# Patient Record
Sex: Female | Born: 1950 | Race: Black or African American | Hispanic: No | Smoking: Former smoker
Health system: Southern US, Community
[De-identification: ages and names within clinical notes are randomized; demographics above are authoritative.]

## PROBLEM LIST (undated history)

## (undated) DIAGNOSIS — E119 Type 2 diabetes mellitus without complications: Secondary | ICD-10-CM

## (undated) DIAGNOSIS — I10 Essential (primary) hypertension: Secondary | ICD-10-CM

## (undated) DIAGNOSIS — C9590 Leukemia, unspecified not having achieved remission: Secondary | ICD-10-CM

## (undated) HISTORY — PX: LUNG SURGERY: SHX703

---

## 2018-05-21 ENCOUNTER — Inpatient Hospital Stay
Admission: EM | Admit: 2018-05-21 | Discharge: 2018-05-22 | DRG: 069 | Disposition: A | Discharge status: Home / Self Care | Payer: Medicaid Other | Attending: Specialist | Admitting: Specialist

## 2018-05-21 ENCOUNTER — Emergency Department: Payer: Medicaid Other

## 2018-05-21 ENCOUNTER — Other Ambulatory Visit: Payer: Self-pay

## 2018-05-21 ENCOUNTER — Inpatient Hospital Stay: Payer: Medicaid Other

## 2018-05-21 ENCOUNTER — Inpatient Hospital Stay (HOSPITAL_COMMUNITY)
Admit: 2018-05-21 | Discharge: 2018-05-21 | Disposition: A | Discharge status: Home / Self Care | Payer: Medicaid Other | Attending: Internal Medicine | Admitting: Internal Medicine

## 2018-05-21 DIAGNOSIS — R29810 Facial weakness: Secondary | ICD-10-CM | POA: Diagnosis present

## 2018-05-21 DIAGNOSIS — G459 Transient cerebral ischemic attack, unspecified: Principal | ICD-10-CM | POA: Diagnosis present

## 2018-05-21 DIAGNOSIS — E1151 Type 2 diabetes mellitus with diabetic peripheral angiopathy without gangrene: Secondary | ICD-10-CM | POA: Diagnosis present

## 2018-05-21 DIAGNOSIS — I129 Hypertensive chronic kidney disease with stage 1 through stage 4 chronic kidney disease, or unspecified chronic kidney disease: Secondary | ICD-10-CM | POA: Diagnosis present

## 2018-05-21 DIAGNOSIS — Z87891 Personal history of nicotine dependence: Secondary | ICD-10-CM | POA: Diagnosis not present

## 2018-05-21 DIAGNOSIS — N189 Chronic kidney disease, unspecified: Secondary | ICD-10-CM | POA: Diagnosis present

## 2018-05-21 DIAGNOSIS — R4781 Slurred speech: Secondary | ICD-10-CM | POA: Diagnosis present

## 2018-05-21 DIAGNOSIS — I639 Cerebral infarction, unspecified: Secondary | ICD-10-CM | POA: Diagnosis present

## 2018-05-21 DIAGNOSIS — Z856 Personal history of leukemia: Secondary | ICD-10-CM | POA: Diagnosis not present

## 2018-05-21 DIAGNOSIS — E1122 Type 2 diabetes mellitus with diabetic chronic kidney disease: Secondary | ICD-10-CM | POA: Diagnosis present

## 2018-05-21 DIAGNOSIS — I5030 Unspecified diastolic (congestive) heart failure: Secondary | ICD-10-CM | POA: Diagnosis not present

## 2018-05-21 HISTORY — DX: Essential (primary) hypertension: I10

## 2018-05-21 HISTORY — DX: Leukemia, unspecified not having achieved remission: C95.90

## 2018-05-21 HISTORY — DX: Type 2 diabetes mellitus without complications: E11.9

## 2018-05-21 LAB — PROTIME-INR
INR: 0.9
PROTHROMBIN TIME: 12.1 s (ref 11.4–15.2)

## 2018-05-21 LAB — CBC WITH DIFFERENTIAL/PLATELET
Basophils Absolute: 0.1 10*3/uL (ref 0–0.1)
Basophils Relative: 1 %
EOS ABS: 0.2 10*3/uL (ref 0–0.7)
EOS PCT: 2 %
HCT: 36.3 % (ref 35.0–47.0)
HEMOGLOBIN: 12.2 g/dL (ref 12.0–16.0)
LYMPHS ABS: 1.1 10*3/uL (ref 1.0–3.6)
Lymphocytes Relative: 11 %
MCH: 31.5 pg (ref 26.0–34.0)
MCHC: 33.5 g/dL (ref 32.0–36.0)
MCV: 94.1 fL (ref 80.0–100.0)
MONOS PCT: 11 %
Monocytes Absolute: 1.2 10*3/uL — ABNORMAL HIGH (ref 0.2–0.9)
NEUTROS PCT: 75 %
Neutro Abs: 7.8 10*3/uL — ABNORMAL HIGH (ref 1.4–6.5)
Platelets: 210 10*3/uL (ref 150–440)
RBC: 3.86 MIL/uL (ref 3.80–5.20)
RDW: 15.5 % — ABNORMAL HIGH (ref 11.5–14.5)
WBC: 10.3 10*3/uL (ref 3.6–11.0)

## 2018-05-21 LAB — COMPREHENSIVE METABOLIC PANEL
ALBUMIN: 3.6 g/dL (ref 3.5–5.0)
ALK PHOS: 195 U/L — AB (ref 38–126)
ALT: 55 U/L — AB (ref 0–44)
AST: 41 U/L (ref 15–41)
Anion gap: 7 (ref 5–15)
BUN: 18 mg/dL (ref 8–23)
CO2: 27 mmol/L (ref 22–32)
CREATININE: 1.62 mg/dL — AB (ref 0.44–1.00)
Calcium: 9.3 mg/dL (ref 8.9–10.3)
Chloride: 106 mmol/L (ref 98–111)
GFR calc Af Amer: 38 mL/min — ABNORMAL LOW (ref >60)
GFR calc non Af Amer: 33 mL/min — ABNORMAL LOW (ref >60)
GLUCOSE: 106 mg/dL — AB (ref 70–99)
Potassium: 5 mmol/L (ref 3.5–5.1)
SODIUM: 140 mmol/L (ref 135–145)
Total Bilirubin: 1.3 mg/dL — ABNORMAL HIGH (ref 0.3–1.2)
Total Protein: 7.2 g/dL (ref 6.5–8.1)

## 2018-05-21 LAB — TROPONIN I: Troponin I: <0.03 ng/mL (ref <0.03)

## 2018-05-21 LAB — GLUCOSE, CAPILLARY
GLUCOSE-CAPILLARY: 77 mg/dL (ref 70–99)
Glucose-Capillary: 136 mg/dL — ABNORMAL HIGH (ref 70–99)

## 2018-05-21 LAB — ECHOCARDIOGRAM COMPLETE

## 2018-05-21 MED ORDER — ASPIRIN 325 MG PO TABS
325.0000 mg | ORAL_TABLET | Freq: Every day | ORAL | Status: DC
  Administered 2018-05-21 – 2018-05-22 (×2): 325 mg via ORAL
  Filled 2018-05-21 (×2): qty 1

## 2018-05-21 MED ORDER — NILOTINIB HCL 150 MG PO CAPS: 300.0000 mg | ORAL_CAPSULE | Freq: Every day | ORAL | Status: DC

## 2018-05-21 MED ORDER — INSULIN ASPART 100 UNIT/ML ~~LOC~~ SOLN: 0.0000 [IU] | Freq: Three times a day (TID) | SUBCUTANEOUS | Status: DC

## 2018-05-21 MED ORDER — ACETAMINOPHEN 650 MG RE SUPP: 650.0000 mg | RECTAL | Status: DC | PRN

## 2018-05-21 MED ORDER — LINAGLIPTIN 5 MG PO TABS
5.0000 mg | ORAL_TABLET | Freq: Every day | ORAL | Status: DC
  Administered 2018-05-21 – 2018-05-22 (×2): 5 mg via ORAL
  Filled 2018-05-21 (×2): qty 1

## 2018-05-21 MED ORDER — ASPIRIN 300 MG RE SUPP: 300.0000 mg | Freq: Every day | RECTAL | Status: DC

## 2018-05-21 MED ORDER — ACETAMINOPHEN 160 MG/5ML PO SOLN
650.0000 mg | ORAL | Status: DC | PRN
  Filled 2018-05-21: qty 20.3

## 2018-05-21 MED ORDER — ACETAMINOPHEN 325 MG PO TABS: 650.0000 mg | ORAL_TABLET | ORAL | Status: DC | PRN

## 2018-05-21 MED ORDER — ENOXAPARIN SODIUM 40 MG/0.4ML ~~LOC~~ SOLN
40.0000 mg | SUBCUTANEOUS | Status: DC
  Administered 2018-05-21: 40 mg via SUBCUTANEOUS
  Filled 2018-05-21: qty 0.4

## 2018-05-21 MED ORDER — ATORVASTATIN CALCIUM 20 MG PO TABS
40.0000 mg | ORAL_TABLET | Freq: Every day | ORAL | Status: DC
  Administered 2018-05-21: 17:00:00 40 mg via ORAL
  Filled 2018-05-21: qty 2

## 2018-05-21 MED ORDER — STROKE: EARLY STAGES OF RECOVERY BOOK
Freq: Once | Status: AC
  Administered 2018-05-21: 14:00:00

## 2018-05-21 NOTE — Progress Notes (Signed)
PT Cancellation Note  Patient Details Name: Jennier Schissler MRN: [id number] DOB: 05-Jun-1951   Cancelled Treatment:    Reason Eval/Treat Not Completed: Patient at procedure or test/unavailable.  Patient is currently with imaging.  Will re-attempt later as time allows.   Roxanne Gates, PT, DPT 05/21/2018, 3:06 PM

## 2018-05-21 NOTE — ED Provider Notes (Signed)
[facility] Emergency Department Provider Note ____________________________________________   I have reviewed the triage vital signs and the triage nursing note.  HISTORY  Chief Complaint Weakness and Facial Droop   Historian Patient  HPI Ruth Fuentes is a 67 y.o. female arrived by EMS with complaint of left facial droop and left arm and left leg weakness.  Patient states it started before lunch yesterday.  She states that she knows she was having trouble gripping her using her left hand.  She states that her left leg seems weak when she walks.  Denies sensory changes.  No headache.  No history of stroke or heart attack in the past.  Does not use aspirin on a daily basis.  Symptoms are moderate.  No confusion or altered mental status.     Past Medical History:  Diagnosis Date  . Diabetes mellitus without complication ([place])   . Hypertension   . Leukemia ([place])   . Leukemia [facility])     Patient Active Problem List   Diagnosis Date Noted  . CVA (cerebral vascular accident) ([place]) 05/21/2018    Past Surgical History:  Procedure Laterality Date  . LUNG SURGERY      Prior to Admission medications   Medication Sig Start Date End Date Taking? Authorizing Provider  amLODipine (NORVASC) 10 MG tablet Take 10 mg by mouth daily.   Yes [provider]  metoprolol tartrate (LOPRESSOR) 100 MG tablet Take 100 mg by mouth 2 (two) times daily.   Yes [provider]  nilotinib (TASIGNA) 150 MG capsule Take 300 mg by mouth daily. Take on an empty stomach, 1 hr before or 2 hrs after food.   Yes [provider]  sitaGLIPtin (JANUVIA) 100 MG tablet Take 100 mg by mouth daily.   Yes [provider]    Allergies  Allergen Reactions  . Metformin And Related     Family History  Problem Relation Age of Onset  . Diabetes Mother     Social History Social History   Tobacco Use  . Smoking status: Former Research scientist (life sciences)  . Smokeless  tobacco: Never Used  Substance Use Topics  . Alcohol use: Not Currently  . Drug use: Never    Review of Systems  Constitutional: Negative for fever. Eyes: Negative for visual changes. ENT: Negative for sore throat. Cardiovascular: Negative for chest pain. Respiratory: Negative for shortness of breath. Gastrointestinal: Negative for abdominal pain, vomiting and diarrhea. Genitourinary: Negative for dysuria. Musculoskeletal: Negative for back pain. Skin: Negative for rash. Neurological: Negative for headache.  ____________________________________________   PHYSICAL EXAM:  VITAL SIGNS: ED Triage Vitals  Enc Vitals Group     BP 05/21/18 0906 129/76     Pulse Rate 05/21/18 1030 88     Resp 05/21/18 0906 18     Temp 05/21/18 0906 98.5 F (36.9 C)     Temp Source 05/21/18 0906 Oral     SpO2 05/21/18 0906 100 %     Weight --      Height --      Head Circumference --      Peak Flow --      Pain Score 05/21/18 0907 0     Pain Loc --      Pain Edu? --      Excl. in [place]? --      Constitutional: Alert and oriented.  HEENT      Head: Normocephalic and atraumatic.  Left facial droop.      Eyes: Conjunctivae are  normal. Pupils equal and round.       Ears:         Nose: No congestion/rhinnorhea.      Mouth/Throat: Mucous membranes are moist.      Neck: No stridor. Cardiovascular/Chest: Normal rate, regular rhythm.  No murmurs, rubs, or gallops. Respiratory: Normal respiratory effort without tachypnea nor retractions. Breath sounds are clear and equal bilaterally. No wheezes/rales/rhonchi. Gastrointestinal: Soft. No distention, no guarding, no rebound. Nontender.    Genitourinary/rectal:Deferred Musculoskeletal: Nontender with normal range of motion in all extremities. No joint effusions.  No lower extremity tenderness.  No edema. Neurologic: Left facial droop.  Normal speech and language.  Sensory changes.  4-5 strength in left arm and left leg. Skin:  Skin is warm, dry and  intact. No rash noted. Psychiatric: Mood and affect are normal. Speech and behavior are normal. Patient exhibits appropriate insight and judgment.   ____________________________________________  LABS (pertinent positives/negatives) I, Lisa Roca, MD the attending physician have reviewed the labs noted below.  Labs Reviewed  COMPREHENSIVE METABOLIC PANEL - Abnormal; Notable for the following components:      Result Value   Glucose, Bld 106 (*)    Creatinine, Ser 1.62 (*)    ALT 55 (*)    Alkaline Phosphatase 195 (*)    Total Bilirubin 1.3 (*)    GFR calc non Af Amer 33 (*)    GFR calc Af Amer 38 (*)    All other components within normal limits  CBC WITH DIFFERENTIAL/PLATELET - Abnormal; Notable for the following components:   RDW 15.5 (*)    Neutro Abs 7.8 (*)    Monocytes Absolute 1.2 (*)    All other components within normal limits  TROPONIN I  PROTIME-INR  URINALYSIS, COMPLETE (UACMP) WITH MICROSCOPIC    ____________________________________________    EKG I, Lisa Roca, MD, the attending physician have personally viewed and interpreted all ECGs.  None ____________________________________________  RADIOLOGY   CT without contrast for by me I reviewed the radiologist report: No acute finding. __________________________________________  PROCEDURES  Procedure(s) performed: None  Procedures  Critical Care performed: None   ____________________________________________  ED COURSE / ASSESSMENT AND PLAN  Pertinent labs & imaging results that were available during my care of the patient were reviewed by me and considered in my medical decision making (see chart for details).    Patient with symptoms of stroke with left-sided facial, arm and leg weakness but onset was yesterday afternoon.  Outside of TPA window.  Did initiate stroke work-up.  Initial head CT eval shows no focal finding, but suspect stroke still will admit to hospitalist for work-up and  MRI.    CONSULTATIONS:   Hospitalist for admission.   Patient / Family / Caregiver informed of clinical course, medical decision-making process, and agree with plan.    ___________________________________________   FINAL CLINICAL IMPRESSION(S) / ED DIAGNOSES   Final diagnoses:  Cerebrovascular accident (CVA), unspecified mechanism ([place])      ___________________________________________         Note: This dictation was prepared with Sales executive. Any transcriptional errors that result from this process are unintentional    Lisa Roca, MD 05/21/18 1225

## 2018-05-21 NOTE — Progress Notes (Signed)
Patient admitted to room 132 from the ED with CVA. Patient alert oriented x 4, family at bedside. No c/o of pain at this time. Patient in bed locked at low position, bedside table, call light and telephone within patient reach. Will continue to monitor patient. Report received from [place], Therapist, sports.

## 2018-05-21 NOTE — Progress Notes (Signed)
Anticoagulation monitoring(Lovenox):   67 yo female ordered Lovenox 30 mg Q24h  Filed Weights   05/21/18 1417  Weight: 173 lb 11.6 oz (78.8 kg)   BMI    Lab Results  Component Value Date   CREATININE 1.62 (H) 05/21/2018   Estimated Creatinine Clearance: 36.1 mL/min (A) (by C-G formula based on SCr of 1.62 mg/dL (H)). Hemoglobin & Hematocrit     Component Value Date/Time   HGB 12.2 05/21/2018 0950   HCT 36.3 05/21/2018 0950     Per Protocol for Patient with estCrcl > 30 ml/min and BMI < 40, will transition to Lovenox 40 mg Q24h.

## 2018-05-21 NOTE — H&P (Addendum)
[place] at [place] NAME: Ruth Fuentes    MR#:  [id number]  DATE OF BIRTH:  Feb 11, 1951  DATE OF ADMISSION:  05/21/2018  PRIMARY CARE PHYSICIAN: Patient, No Pcp Per   REQUESTING/REFERRING PHYSICIAN: Lisa Roca Md  CHIEF COMPLAINT:   Chief Complaint  Patient presents with  . Weakness  . Facial Droop    HISTORY OF PRESENT ILLNESS: Ruth Fuentes  is a 67 y.o. female with a known history of diabetes type 2, essential hypertension, leukemia who is currently visiting her family here presenting with complaint of left upper extremity left lower extremity weakness associated with facial droop.  Patient states that she was in her normal state of health until yesterday morning when she developed the symptoms she started having difficulty with walking.  She also stated that yesterday she was trying to drink soda and she started coughing.  Patient feels unsteady on her feet.  Work-up in the ER with a CT scan of the head is negative but there is high suspicion for stroke.  PAST MEDICAL HISTORY:   Past Medical History:  Diagnosis Date  . Diabetes mellitus without complication ([place])   . Hypertension   . Leukemia ([place])   . Leukemia ([place])     PAST SURGICAL HISTORY:  Past Surgical History:  Procedure Laterality Date  . LUNG SURGERY      SOCIAL HISTORY:  Social History   Tobacco Use  . Smoking status: Former Research scientist (life sciences)  . Smokeless tobacco: Never Used  Substance Use Topics  . Alcohol use: Not Currently    FAMILY HISTORY:  Family History  Problem Relation Age of Onset  . Diabetes Mother     DRUG ALLERGIES:  Allergies  Allergen Reactions  . Metformin And Related     REVIEW OF SYSTEMS:   CONSTITUTIONAL: No fever, fatigue or weakness.  EYES: No blurred or double vision.  EARS, NOSE, AND THROAT: No tinnitus or ear pain.  RESPIRATORY: No cough, shortness of breath, wheezing or hemoptysis.  CARDIOVASCULAR: No chest pain, orthopnea,  edema.  GASTROINTESTINAL: No nausea, vomiting, diarrhea or abdominal pain.  GENITOURINARY: No dysuria, hematuria.  ENDOCRINE: No polyuria, nocturia,  HEMATOLOGY: No anemia, easy bruising or bleeding SKIN: No rash or lesion. MUSCULOSKELETAL: No joint pain or arthritis.   NEUROLOGIC: Positive facial droop, left upper extremity left lower extremity weakness PSYCHIATRY: No anxiety or depression.   MEDICATIONS AT HOME:  Prior to Admission medications   Not on File      PHYSICAL EXAMINATION:   VITAL SIGNS: Blood pressure 131/71, pulse 60, temperature 98.6 F (37 C), temperature source Oral, resp. rate 20, SpO2 97 %.  GENERAL:  67 y.o.-year-old patient lying in the bed with no acute distress.  EYES: Pupils equal, round, reactive to light and accommodation. No scleral icterus. Extraocular muscles intact.  HEENT: Head atraumatic, normocephalic. Oropharynx and nasopharynx clear.  NECK:  Supple, no jugular venous distention. No thyroid enlargement, no tenderness.  LUNGS: Normal breath sounds bilaterally, no wheezing, rales,rhonchi or crepitation. No use of accessory muscles of respiration.  CARDIOVASCULAR: S1, S2 normal. No murmurs, rubs, or gallops.  ABDOMEN: Soft, nontender, nondistended. Bowel sounds present. No organomegaly or mass.  EXTREMITIES: No pedal edema, cyanosis, or clubbing.  NEUROLOGIC: Left-sided facial droop,, left upper extremity left lower extremity weakness 4 out of 5 PSYCHIATRIC: The patient is alert and oriented x 3.  SKIN: No obvious rash, lesion, or ulcer.   LABORATORY PANEL:   CBC Recent Labs  Lab  05/21/18 0950  WBC 10.3  HGB 12.2  HCT 36.3  PLT 210  MCV 94.1  MCH 31.5  MCHC 33.5  RDW 15.5*  LYMPHSABS 1.1  MONOABS 1.2*  EOSABS 0.2  BASOSABS 0.1   ------------------------------------------------------------------------------------------------------------------  Chemistries  Recent Labs  Lab 05/21/18 0950  NA 140  K 5.0  CL 106  CO2 27   GLUCOSE 106*  BUN 18  CREATININE 1.62*  CALCIUM 9.3  AST 41  ALT 55*  ALKPHOS 195*  BILITOT 1.3*   ------------------------------------------------------------------------------------------------------------------ CrCl cannot be calculated (Unknown ideal weight.). ------------------------------------------------------------------------------------------------------------------ No results for input(s): TSH, T4TOTAL, T3FREE, THYROIDAB in the last 72 hours.  Invalid input(s): FREET3   Coagulation profile Recent Labs  Lab 05/21/18 0950  INR 0.90   ------------------------------------------------------------------------------------------------------------------- No results for input(s): DDIMER in the last 72 hours. -------------------------------------------------------------------------------------------------------------------  Cardiac Enzymes Recent Labs  Lab 05/21/18 0950  TROPONINI <0.03   ------------------------------------------------------------------------------------------------------------------ Invalid input(s): POCBNP  ---------------------------------------------------------------------------------------------------------------  Urinalysis No results found for: COLORURINE, APPEARANCEUR, LABSPEC, PHURINE, GLUCOSEU, HGBUR, BILIRUBINUR, KETONESUR, PROTEINUR, UROBILINOGEN, NITRITE, LEUKOCYTESUR   RADIOLOGY: Ct Head Wo Contrast  Result Date: 05/21/2018 CLINICAL DATA:  Left-sided facial droop and left-sided weakness EXAM: CT HEAD WITHOUT CONTRAST TECHNIQUE: Contiguous axial images were obtained from the base of the skull through the vertex without intravenous contrast. COMPARISON:  None. FINDINGS: Brain: There is mild-to-moderate atrophy. There is no intracranial mass, hemorrhage, extra-axial fluid collection, or midline shift. There is decreased attenuation felt to represent small vessel disease in the centra semiovale bilaterally. No acute appearing infarct  evident. Vascular: No hyperdense vessel. There is calcification in each carotid siphon region. Skull: Bony calvarium appears intact. Sinuses/Orbits: There is mucosal thickening in several ethmoid air cells. Other visualized paranasal sinuses are clear. Visualized orbits appear symmetric bilaterally. Other: Visualized mastoid air cells are clear. IMPRESSION: Atrophy with supratentorial small vessel disease. No acute infarct evident. No mass or hemorrhage. There are foci of carotid artery calcification. There is mucosal thickening in several ethmoid air cells. Electronically Signed   By: Lowella Grip III M.D.   On: 05/21/2018 09:37    EKG: No orders found for this or any previous visit.  IMPRESSION AND PLAN: Patient 68 year old with history of diabetes presenting with strokelike symptoms  1.  Acute CVA CT scan is negative We will obtain MRI MRA of the brain, carotid Dopplers, echocardiogram of the heart Swallow eval PT eval Start aspirin Start Lipitor Neurology evaluation  2.  Diabetes type 2 we will obtain home patient's home medication resume once available Place patient on sliding scale insulin once started on a diet  3.  Chronic kidney disease patient's creatinine from 2017 was 1.30 I have no further creatinine available in the system  4.  Essential hypertension pressure currently normal we will hold blood pressure medications for now  5.  History of leukemia according to patient she states that she is on some sort of pills will resume once known with that pill is  6.  Miscellaneous Lovenox for DVT prophylaxis     All the records are reviewed and case discussed with ED provider. Management plans discussed with the patient, family and they are in agreement.  CODE STATUS:    TOTAL TIME TAKING CARE OF THIS PATIENT: 55 minutes.    Dustin Flock M.D on 05/21/2018 at 1:33 PM  Between 7am to 6pm - Pager - [phone]  After 6pm go to [email or web address] - password [employer]  [facility] Office  [phone]  CC: Primary care physician; Patient,  No Pcp Per

## 2018-05-21 NOTE — ED Triage Notes (Signed)
Pt came to ED via EMS from home. Pt lives at home with daughter. Yesterday around noon, pt noticed heaviness in left leg. Pt went to sleep around 10pm last night. This morning daughter noticed left sided facial droop. Pt is alert and oriented, noticeable left side weakness. VS stable at this time.

## 2018-05-21 NOTE — Plan of Care (Signed)
  Problem: Education: Goal: Knowledge of disease or condition will improve Outcome: Progressing Goal: Knowledge of secondary prevention will improve Outcome: Progressing   Problem: Coping: Goal: Will verbalize positive feelings about self Outcome: Progressing   Problem: Self-Care: Goal: Ability to participate in self-care as condition permits will improve Outcome: Progressing Goal: Ability to communicate needs accurately will improve Outcome: Progressing   Problem: Education: Goal: Knowledge of General Education information will improve Description Including pain rating scale, medication(s)/side effects and non-pharmacologic comfort measures Outcome: Progressing   Problem: Clinical Measurements: Goal: Diagnostic test results will improve Outcome: Progressing   Problem: Activity: Goal: Risk for activity intolerance will decrease Outcome: Progressing

## 2018-05-21 NOTE — Progress Notes (Signed)
*  PRELIMINARY RESULTS* Echocardiogram 2D Echocardiogram has been performed.  Sherrie Sport 05/21/2018, 1:59 PM

## 2018-05-21 NOTE — Progress Notes (Signed)
OT Cancellation Note  Patient Details Name: Ruth Fuentes MRN: [id number] DOB: [birth year]   Cancelled Treatment:    Reason Eval/Treat Not Completed: Patient at procedure or test/ unavailable. Order received, chart reviewed. Pt out of room for diagnostic testing. Will re-attempt OT evaluation at later date/time as pt is available and medically appropriate.  Jeni Salles, MPH, MS, OTR/L ascom [phone] 05/21/18, 2:38 PM

## 2018-05-21 NOTE — Consult Note (Signed)
Referring Physician: Dustin Flock, MD    Chief Complaint: Left facial droop, left-sided weakness  HPI: Ruth Fuentes is an 67 y.o. female with history of diabetes type 2, hypertension, and leukemia presented to the ED with complaints of left facial droop, left upper and lower extremity weakness.  Patient states that she was at home babysitting 1 of her granddaughters yesterday at around lunchtime when she suddenly felt heaviness in her left leg and left.  She felt like she could not move left hand and leg leg.  She was also unsteady on her feet. She denies other associated symptoms of confusion, visual disturbance, headaches, dizziness, or other focal neurological deficit. Patient state that she went to bed hoping that the symptoms would resolve on their own.  When she woke up in the morning, daughter noticed that her left face was drooped and she was slurring her speech, she therefore called EMS due to concerns of stroke.  Patient had a stroke work-up in the ED including CT of the head which not show any acute intracranial abnormality.  Due to her present focal neurologic deficit and stroke risk factors she was admitted for further stroke work-up.  Date last known well: Date: 05/20/2018 Time last known well: Time: 10:00 tPA Given: No: outside window period  Past Medical History:  Diagnosis Date  . Diabetes mellitus without complication ([place])   . Hypertension   . Leukemia ([employer])   . Leukemia [facility])     Past Surgical History:  Procedure Laterality Date  . LUNG SURGERY      Family History  Problem Relation Age of Onset  . Diabetes Mother    Social History:  reports that she has quit smoking. She has never used smokeless tobacco. She reports that she drank alcohol. She reports that she does not use drugs.  Allergies:  Allergies  Allergen Reactions  . Metformin And Related     Medications:  I have reviewed the patient's current medications. Prior to Admission:  Medications  Prior to Admission  Medication Sig Dispense Refill Last Dose  . amLODipine (NORVASC) 10 MG tablet Take 10 mg by mouth daily.   05/20/2018 at Unknown time  . metoprolol tartrate (LOPRESSOR) 100 MG tablet Take 100 mg by mouth 2 (two) times daily.   05/20/2018 at Unknown time  . nilotinib (TASIGNA) 150 MG capsule Take 300 mg by mouth daily. Take on an empty stomach, 1 hr before or 2 hrs after food.   05/20/2018 at Unknown time  . sitaGLIPtin (JANUVIA) 100 MG tablet Take 100 mg by mouth daily.   05/20/2018 at Unknown time   Scheduled: . aspirin  300 mg Rectal Daily   Or  . aspirin  325 mg Oral Daily  . atorvastatin  40 mg Oral q1800  . enoxaparin (LOVENOX) injection  40 mg Subcutaneous Q24H  . insulin aspart  0-9 Units Subcutaneous TID WC  . linagliptin  5 mg Oral Daily  . nilotinib  300 mg Oral Daily    ROS: History obtained from the patient   General ROS: negative for - chills, fatigue, fever, night sweats, weight gain or weight loss Psychological ROS: negative for - behavioral disorder, hallucinations, memory difficulties, mood swings or suicidal ideation Ophthalmic ROS: negative for - blurry vision, double vision, eye pain or loss of vision ENT ROS: negative for - epistaxis, nasal discharge, oral lesions, sore throat, tinnitus or vertigo Allergy and Immunology ROS: negative for - hives or itchy/watery eyes Hematological and Lymphatic ROS: negative for -  bleeding problems, bruising or swollen lymph nodes Endocrine ROS: negative for - galactorrhea, hair pattern changes, polydipsia/polyuria or temperature intolerance Respiratory ROS: negative for - cough, hemoptysis, shortness of breath or wheezing Cardiovascular ROS: negative for - chest pain, dyspnea on exertion, edema or irregular heartbeat Gastrointestinal ROS: negative for - abdominal pain, diarrhea, hematemesis, nausea/vomiting or stool incontinence Genito-Urinary ROS: negative for - dysuria, hematuria, incontinence or urinary  frequency/urgency Musculoskeletal ROS: negative for - joint swelling or muscular weakness Neurological ROS: as noted in HPI Dermatological ROS: negative for rash and skin lesion changes  Physical Exam   Vitals Blood pressure 130/68, pulse 74, temperature 98 F (36.7 C), temperature source Oral, resp. rate 17, height 5' 4.57" (1.64 m), weight 78.8 kg, SpO2 94 %.   HEENT-  Normocephalic, no lesions, without obvious abnormality.  Normal external eye and conjunctiva.  Normal TM's bilaterally.  Normal auditory canals and external ears. Normal external nose, mucus membranes and septum.  Normal pharynx. Cardiovascular- S1, S2 normal, pulses palpable throughout   Lungs- chest clear, no wheezing, rales, normal symmetric air entry Abdomen- soft, non-tender; bowel sounds normal; no masses,  no organomegaly Extremities- no edema Lymph-no adenopathy palpable Musculoskeletal-no joint tenderness, deformity or swelling Skin-warm and dry, no hyperpigmentation, vitiligo, or suspicious lesions  Neurological Exam   Mental Status: Alert, oriented, thought content appropriate. Speech mildly slurred without evidence of aphasia. Able to follow 3 step commands without difficulty. Attention span and concentration seemed appropriate  Cranial Nerves: II: Discs flat bilaterally; Visual fields grossly normal, pupils equal, round, reactive to light and accommodation III,IV, VI: ptosis not present, extra-ocular motions intact bilaterally V,VII: smile asymmetric,Left facial droop,facial light touch sensationintact VIII: hearing normal bilaterally IX,X: gag reflex present XI: bilateral shoulder shrug XII: midline tongue extension Motor: Right :Upper extremity 5/5Without pronator driftLeft: Upper extremity 4+/5 without pronator drift Right:Lower extremity 5/5Left: Lower extremity 4+/5 Tone and bulk:normal tone throughout; no atrophy noted Sensory:  Pinprick and light touchdecreased on the right Deep Tendon Reflexes: 2+ and symmetric throughout Plantars: Right:muteLeft: mute Cerebellar: Finger-to-nosetesting intact bilaterally.Heel to shin testing normal bilaterally Gait: not tested due to safety concerns  Data Reviewed  Laboratory Studies:  Basic Metabolic Panel: Recent Labs  Lab 05/21/18 0950  NA 140  K 5.0  CL 106  CO2 27  GLUCOSE 106*  BUN 18  CREATININE 1.62*  CALCIUM 9.3    Liver Function Tests: Recent Labs  Lab 05/21/18 0950  AST 41  ALT 55*  ALKPHOS 195*  BILITOT 1.3*  PROT 7.2  ALBUMIN 3.6   No results for input(s): LIPASE, AMYLASE in the last 168 hours. No results for input(s): AMMONIA in the last 168 hours.  CBC: Recent Labs  Lab 05/21/18 0950  WBC 10.3  NEUTROABS 7.8*  HGB 12.2  HCT 36.3  MCV 94.1  PLT 210    Cardiac Enzymes: Recent Labs  Lab 05/21/18 0950  TROPONINI <0.03    BNP: Invalid input(s): POCBNP  CBG: Recent Labs  Lab 05/21/18 8250  NLZJQB 34    Microbiology: No results found for this or any previous visit.  Coagulation Studies: Recent Labs    05/21/18 0950  LABPROT 12.1  INR 0.90    Urinalysis: No results for input(s): COLORURINE, LABSPEC, PHURINE, GLUCOSEU, HGBUR, BILIRUBINUR, KETONESUR, PROTEINUR, UROBILINOGEN, NITRITE, LEUKOCYTESUR in the last 168 hours.  Invalid input(s): APPERANCEUR  Lipid Panel: No results found for: CHOL, TRIG, HDL, CHOLHDL, VLDL, LDLCALC  HgbA1C: No results found for: HGBA1C  Urine Drug Screen:  No results found  for: LABOPIA, COCAINSCRNUR, LABBENZ, AMPHETMU, THCU, LABBARB  Alcohol Level: No results for input(s): ETH in the last 168 hours.   Imaging: Ct Head Wo Contrast  Result Date: 05/21/2018 CLINICAL DATA:  Left-sided facial droop and left-sided weakness EXAM: CT HEAD WITHOUT CONTRAST TECHNIQUE: Contiguous axial images were obtained from the base of the skull through the vertex without  intravenous contrast. COMPARISON:  None. FINDINGS: Brain: There is mild-to-moderate atrophy. There is no intracranial mass, hemorrhage, extra-axial fluid collection, or midline shift. There is decreased attenuation felt to represent small vessel disease in the centra semiovale bilaterally. No acute appearing infarct evident. Vascular: No hyperdense vessel. There is calcification in each carotid siphon region. Skull: Bony calvarium appears intact. Sinuses/Orbits: There is mucosal thickening in several ethmoid air cells. Other visualized paranasal sinuses are clear. Visualized orbits appear symmetric bilaterally. Other: Visualized mastoid air cells are clear. IMPRESSION: Atrophy with supratentorial small vessel disease. No acute infarct evident. No mass or hemorrhage. There are foci of carotid artery calcification. There is mucosal thickening in several ethmoid air cells. Electronically Signed   By: Lowella Grip III M.D.   On: 05/21/2018 09:37   Mr Brain Wo Contrast  Result Date: 05/21/2018 CLINICAL DATA:  Initial evaluation for acute left-sided weakness, facial droop. EXAM: MRI HEAD WITHOUT CONTRAST MRA HEAD WITHOUT CONTRAST TECHNIQUE: Multiplanar, multiecho pulse sequences of the brain and surrounding structures were obtained without intravenous contrast. Angiographic images of the head were obtained using MRA technique without contrast. COMPARISON:  Prior CT from earlier the same day. FINDINGS: MRI HEAD FINDINGS Brain: Diffuse prominence of the CSF containing spaces compatible with generalized cerebral atrophy. Prominent patchy and confluent T2/FLAIR hyperintensity within the periventricular and deep white matter both cerebral hemispheres most consistent with chronic small vessel ischemic disease, moderate in nature. Chronic microvascular changes present within the pons. Few small remote bilateral cerebellar infarcts noted. Patchy small volume diffusion abnormality seen involving the right basal  ganglia/corona radiata (series 5, image 31). Mild patchy involvement of the posterior right lentiform nucleus (series 5, image 27). No associated mass effect or hemorrhage. No other evidence for acute or subacute ischemia. No acute intracranial hemorrhage. No mass lesion, midline shift or mass effect. Diffuse ventricular prominence most like related global parenchymal volume loss of hydrocephalus. Underlying component of NPH would be difficult to exclude. Pituitary gland normal. Vascular: Major intracranial vascular flow voids maintained. Skull and upper cervical spine: Craniocervical junction normal. No focal marrow replacing lesion. Scalp soft tissues unremarkable. Sinuses/Orbits: Globes and orbital soft tissues within normal limits. Paranasal sinuses are clear. Trace right mastoid effusion, of doubtful significance. Other: None. MRA HEAD FINDINGS ANTERIOR CIRCULATION: Examination technically degraded by motion artifact. Petrous segments widely patent bilaterally. Scattered atheromatous irregularity within the cavernous/supraclinoid ICAs without hemodynamically significant stenosis. Focal severe stenosis at the proximal right A1 segment (series 1039, image 12). A1 segments otherwise patent. Grossly normal anterior communicating artery. ACAs irregular but patent to their distal aspects without stenosis. M1 segments mildly irregular as well without high-grade stenosis. Moderate atheromatous irregularity throughout the distal MCA branches which are fairly well perfused and symmetric. POSTERIOR CIRCULATION: Visualized vertebral arteries patent to the vertebrobasilar junction without stenosis. Right vertebral artery dominant. Basilar mildly irregular but widely patent to its distal aspect without stenosis. Superior cerebral arteries patent bilaterally. Both of the posterior cerebral artery supplied via the basilar. Short-segment moderate proximal left P2 and distal right P2 stenoses. PCAs patent to their distal  aspects. No intracranial aneurysm. IMPRESSION: MRI HEAD IMPRESSION: 1. Patchy small  volume acute ischemic infarcts involving the right basal ganglia/corona radiata without mass effect. 2. Underlying moderate to advanced chronic microvascular ischemic disease, with few scatter remote bilateral cerebellar infarcts. 3. Diffuse ventricular prominence, most likely related to underlying global atrophy. A superimposed component of NPH would be difficult to exclude, and could be considered in the correct clinical setting. MRA HEAD IMPRESSION: 1. Negative intracranial MRA for large vessel occlusion. No proximal high-grade or correctable stenosis identified. 2. Moderate diffuse atheromatous irregularity throughout the intracranial circulation. Electronically Signed   By: Jeannine Boga M.D.   On: 05/21/2018 15:43   US Carotid Bilateral (at [facility] And [facility] Only)  Result Date: 05/21/2018 CLINICAL DATA:  Stroke.  Left-sided weakness.  Mouth droop. EXAM: BILATERAL CAROTID DUPLEX ULTRASOUND TECHNIQUE: Pearline Cables scale imaging, color Doppler and duplex ultrasound were performed of bilateral carotid and vertebral arteries in the neck. COMPARISON:  None. FINDINGS: Criteria: Quantification of carotid stenosis is based on velocity parameters that correlate the residual internal carotid diameter with NASCET-based stenosis levels, using the diameter of the distal internal carotid lumen as the denominator for stenosis measurement. The following velocity measurements were obtained: RIGHT ICA: 105 cm/sec CCA: 093 cm/sec SYSTOLIC ICA/CCA RATIO:  0.8 ECA: 112 cm/sec LEFT ICA: 86 cm/sec CCA: 98 cm/sec SYSTOLIC ICA/CCA RATIO:  0.9 ECA: 268 cm/sec RIGHT CAROTID ARTERY: Mild smooth soft plaque in the bulb. Low resistance internal carotid Doppler pattern. RIGHT VERTEBRAL ARTERY:  Antegrade. LEFT CAROTID ARTERY: There is smooth mixed plaque in the lower bulb. In the mid and upper bulb, there is irregular calcified plaque which extends into the  external carotid artery. Low resistance internal carotid Doppler pattern is preserved. LEFT VERTEBRAL ARTERY:  Could not be visualized. IMPRESSION: Less than 50% stenosis in the right and left internal carotid arteries. Right vertebral artery is antegrade in flow. The left vertebral artery could not be identified. It may be occluded. Electronically Signed   By: Marybelle Killings M.D.   On: 05/21/2018 16:39   Mr Jodene Nam Head/brain GH Cm  Result Date: 05/21/2018 CLINICAL DATA:  Initial evaluation for acute left-sided weakness, facial droop. EXAM: MRI HEAD WITHOUT CONTRAST MRA HEAD WITHOUT CONTRAST TECHNIQUE: Multiplanar, multiecho pulse sequences of the brain and surrounding structures were obtained without intravenous contrast. Angiographic images of the head were obtained using MRA technique without contrast. COMPARISON:  Prior CT from earlier the same day. FINDINGS: MRI HEAD FINDINGS Brain: Diffuse prominence of the CSF containing spaces compatible with generalized cerebral atrophy. Prominent patchy and confluent T2/FLAIR hyperintensity within the periventricular and deep white matter both cerebral hemispheres most consistent with chronic small vessel ischemic disease, moderate in nature. Chronic microvascular changes present within the pons. Few small remote bilateral cerebellar infarcts noted. Patchy small volume diffusion abnormality seen involving the right basal ganglia/corona radiata (series 5, image 31). Mild patchy involvement of the posterior right lentiform nucleus (series 5, image 27). No associated mass effect or hemorrhage. No other evidence for acute or subacute ischemia. No acute intracranial hemorrhage. No mass lesion, midline shift or mass effect. Diffuse ventricular prominence most like related global parenchymal volume loss of hydrocephalus. Underlying component of NPH would be difficult to exclude. Pituitary gland normal. Vascular: Major intracranial vascular flow voids maintained. Skull and upper  cervical spine: Craniocervical junction normal. No focal marrow replacing lesion. Scalp soft tissues unremarkable. Sinuses/Orbits: Globes and orbital soft tissues within normal limits. Paranasal sinuses are clear. Trace right mastoid effusion, of doubtful significance. Other: None. MRA HEAD FINDINGS ANTERIOR CIRCULATION: Examination technically degraded by  motion artifact. Petrous segments widely patent bilaterally. Scattered atheromatous irregularity within the cavernous/supraclinoid ICAs without hemodynamically significant stenosis. Focal severe stenosis at the proximal right A1 segment (series 1039, image 12). A1 segments otherwise patent. Grossly normal anterior communicating artery. ACAs irregular but patent to their distal aspects without stenosis. M1 segments mildly irregular as well without high-grade stenosis. Moderate atheromatous irregularity throughout the distal MCA branches which are fairly well perfused and symmetric. POSTERIOR CIRCULATION: Visualized vertebral arteries patent to the vertebrobasilar junction without stenosis. Right vertebral artery dominant. Basilar mildly irregular but widely patent to its distal aspect without stenosis. Superior cerebral arteries patent bilaterally. Both of the posterior cerebral artery supplied via the basilar. Short-segment moderate proximal left P2 and distal right P2 stenoses. PCAs patent to their distal aspects. No intracranial aneurysm. IMPRESSION: MRI HEAD IMPRESSION: 1. Patchy small volume acute ischemic infarcts involving the right basal ganglia/corona radiata without mass effect. 2. Underlying moderate to advanced chronic microvascular ischemic disease, with few scatter remote bilateral cerebellar infarcts. 3. Diffuse ventricular prominence, most likely related to underlying global atrophy. A superimposed component of NPH would be difficult to exclude, and could be considered in the correct clinical setting. MRA HEAD IMPRESSION: 1. Negative intracranial  MRA for large vessel occlusion. No proximal high-grade or correctable stenosis identified. 2. Moderate diffuse atheromatous irregularity throughout the intracranial circulation. Electronically Signed   By: Jeannine Boga M.D.   On: 05/21/2018 15:43    Assessment: 67 y.o. 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 She also had MRA head which did not show large vessel occlusion.  Echocardiogram currently pending.  Carotid dopplers show no evidence of hemodynamically significant stenosis in the carotid arteries, with questionable left vertebral occlusion.  A1c pending, LDL pending.Patient was not on any anticoagulant or antiplatelet therapy prior to this event.  Stroke Risk Factors - diabetes mellitus and hypertension  Plan: 1. HgbA1c, fasting lipid panel pending 2. Start Aspirin 81 mg daily 3. PT consult, OT consult, Speech consult 4. Echocardiogram pending 5. NPO until RN stroke swallow screen 6. Telemetry monitoring 7. Frequent neuro checks   Alexis Goodell, MD Neurology [phone] 05/21/2018, 8:49 PM

## 2018-05-22 LAB — URINALYSIS, COMPLETE (UACMP) WITH MICROSCOPIC
Bilirubin Urine: NEGATIVE
Glucose, UA: NEGATIVE mg/dL
Hgb urine dipstick: NEGATIVE
Ketones, ur: NEGATIVE mg/dL
Nitrite: NEGATIVE
Protein, ur: 30 mg/dL — AB
SPECIFIC GRAVITY, URINE: 1.016 (ref 1.005–1.030)
pH: 6 (ref 5.0–8.0)

## 2018-05-22 LAB — LIPID PANEL
Cholesterol: 223 mg/dL — ABNORMAL HIGH (ref 0–200)
HDL: 51 mg/dL (ref >40)
LDL CALC: 152 mg/dL — AB (ref 0–99)
Total CHOL/HDL Ratio: 4.4 RATIO
Triglycerides: 99 mg/dL (ref <150)
VLDL: 20 mg/dL (ref 0–40)

## 2018-05-22 LAB — GLUCOSE, CAPILLARY
GLUCOSE-CAPILLARY: 111 mg/dL — AB (ref 70–99)
Glucose-Capillary: 109 mg/dL — ABNORMAL HIGH (ref 70–99)

## 2018-05-22 LAB — HEMOGLOBIN A1C
HEMOGLOBIN A1C: 6.4 % — AB (ref 4.8–5.6)
Mean Plasma Glucose: 136.98 mg/dL

## 2018-05-22 MED ORDER — ASPIRIN EC 81 MG PO TBEC: 81.0000 mg | DELAYED_RELEASE_TABLET | Freq: Every day | ORAL | 1 refills | Status: AC

## 2018-05-22 MED ORDER — ATORVASTATIN CALCIUM 40 MG PO TABS: 40.0000 mg | ORAL_TABLET | Freq: Every day | ORAL | 1 refills | Status: AC

## 2018-05-22 MED ORDER — NILOTINIB HCL 150 MG PO CAPS
300.0000 mg | ORAL_CAPSULE | Freq: Two times a day (BID) | ORAL | Status: DC
  Administered 2018-05-22: 11:00:00 300 mg via ORAL
  Filled 2018-05-22 (×2): qty 2

## 2018-05-22 NOTE — Discharge Summary (Signed)
[place] [place] [facility] [place] NAME: Ruth Fuentes    MR#:  [id number]  DATE OF BIRTH:  [birth year]  DATE OF ADMISSION:  05/21/2018 ADMITTING PHYSICIAN: Dustin Flock, MD  DATE OF DISCHARGE: 05/22/2018  PRIMARY CARE PHYSICIAN: Ruth Fuentes, Ruth Fuentes    ADMISSION DIAGNOSIS:  stroke like symptoms  DISCHARGE DIAGNOSIS:  Active Problems:   CVA (cerebral vascular accident) ([place])   SECONDARY DIAGNOSIS:   Past Medical History:  Diagnosis Date  . Diabetes mellitus without complication ([place])   . Hypertension   . Leukemia ([place])   . Leukemia [facility])     HOSPITAL COURSE:   67 year old female with past medical history of CML, diabetes, hypertension who presented to the hospital due to left sided weakness and numbness and also a left-sided facial droop.  1.  Acute CVA-this was the cause of Ruth Fuentes's left-sided weakness numbness and facial droop.  Shortly after admission Ruth Fuentes's neurological symptoms had resolved and are back to baseline. - Ruth Fuentes CT head on admission was negative, but her MRI of the brain showed small volume acute ischemic infarcts involving the right basal ganglia/corona radiata without mass effect. -Ruth Fuentes was seen by neurology and started on aspirin and high-dose intensity statin.  Ruth Fuentes was seen by speech, physical therapy and Occupational Therapy too.  She did not require any services.  She is currently stable and therefore being discharged on aspirin and statin. -Her carotid duplex showed Ruth hemodynamically significant carotid stenosis and a transthoracic echocardiogram showed Ruth evidence of acute thrombus.  2.  Essential hypertension-Ruth Fuentes will continue her Norvasc, metoprolol.  3.  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 Hx of DM - pt. Will cont. Januvia.    DISCHARGE CONDITIONS:   Stable.   CONSULTS OBTAINED:  Treatment Team:  Alexis Goodell, MD  DRUG ALLERGIES:   Allergies  Allergen Reactions  .  Metformin And Related     DISCHARGE MEDICATIONS:   Allergies as of 05/22/2018      Reactions   Metformin And Related       Medication List    TAKE these medications   amLODipine 10 MG tablet Commonly known as:  NORVASC Take 10 mg by mouth daily.   aspirin EC 81 MG tablet Take 1 tablet (81 mg total) by mouth daily.   atorvastatin 40 MG tablet Commonly known as:  LIPITOR Take 1 tablet (40 mg total) by mouth daily at 6 PM.   metoprolol tartrate 100 MG tablet Commonly known as:  LOPRESSOR Take 100 mg by mouth 2 (two) times daily.   sitaGLIPtin 100 MG tablet Commonly known as:  JANUVIA Take 100 mg by mouth daily.   TASIGNA 150 MG capsule Generic drug:  nilotinib Take 300 mg by mouth every 12 (twelve) hours. Take on an empty stomach, 1 hr before or 2 hrs after food.         DISCHARGE INSTRUCTIONS:   DIET:  Cardiac diet and Diabetic diet  DISCHARGE CONDITION:  Stable  ACTIVITY:  Activity as tolerated  OXYGEN:  Home Oxygen: Ruth.   Oxygen Delivery: room air  DISCHARGE LOCATION:  home   If you experience worsening of your admission symptoms, develop shortness of breath, life threatening emergency, suicidal or homicidal thoughts you must seek medical attention immediately by calling 911 or calling your MD immediately  if symptoms less severe.  You Must read complete instructions/literature along with all the possible adverse reactions/side effects for all the Medicines you take and  that have been prescribed to you. Take any new Medicines after you have completely understood and accpet all the possible adverse reactions/side effects.   Please note  You were cared for by a hospitalist during your hospital stay. If you have any questions about your discharge medications or the care you received while you were in the hospital after you are discharged, you can call the unit and asked to speak with the hospitalist on call if the hospitalist that took care of you is not  available. Once you are discharged, your primary care physician will handle any further medical issues. Please note that Ruth REFILLS for any discharge medications will be authorized once you are discharged, as it is imperative that you return to your primary care physician (or establish a relationship with a primary care physician if you do not have one) for your aftercare needs so that they can reassess your need for medications and monitor your lab values.     Today   Left sided weakness/numbness much improved and back to baseline.  Ruth complaints.  Will d/c home today.   VITAL SIGNS:  Blood pressure (!) 141/91, pulse 89, temperature 98.5 F (36.9 C), temperature source Oral, resp. rate 18, height 5' 4.57" (1.64 m), weight 78.8 kg, SpO2 96 %.  I/O:    Intake/Output Summary (Last 24 hours) at 05/22/2018 1422 Last data filed at 05/22/2018 1019 Gross Fuentes 24 hour  Intake 390 ml  Output -  Net 390 ml    PHYSICAL EXAMINATION:  GENERAL:  67 y.o.-year-old Ruth Fuentes lying in the bed with Ruth acute distress.  EYES: Pupils equal, round, reactive to light and accommodation. Ruth scleral icterus. Extraocular muscles intact.  HEENT: Head atraumatic, normocephalic. Oropharynx and nasopharynx clear.  NECK:  Supple, Ruth jugular venous distention. Ruth thyroid enlargement, Ruth tenderness.  LUNGS: Normal breath sounds bilaterally, Ruth wheezing, rales,rhonchi. Ruth use of accessory muscles of respiration.  CARDIOVASCULAR: S1, S2 normal. Ruth murmurs, rubs, or gallops.  ABDOMEN: Soft, non-tender, non-distended. Bowel sounds present. Ruth organomegaly or mass.  EXTREMITIES: Ruth pedal edema, cyanosis, or clubbing.  NEUROLOGIC: Cranial nerves II through XII are intact. Ruth facial droop. Slight left sided weakness.    PSYCHIATRIC: The Ruth Fuentes is alert and oriented x 3. Good affect.  SKIN: Ruth obvious rash, lesion, or ulcer.   DATA REVIEW:   CBC Recent Labs  Lab 05/21/18 0950  WBC 10.3  HGB 12.2  HCT 36.3  PLT 210     Chemistries  Recent Labs  Lab 05/21/18 0950  NA 140  K 5.0  CL 106  CO2 27  GLUCOSE 106*  BUN 18  CREATININE 1.62*  CALCIUM 9.3  AST 41  ALT 55*  ALKPHOS 195*  BILITOT 1.3*    Cardiac Enzymes Recent Labs  Lab 05/21/18 0950  TROPONINI <0.03    Microbiology Results  Ruth results found for this or any previous visit.  RADIOLOGY:  Ct Head Wo Contrast  Result Date: 05/21/2018 CLINICAL DATA:  Left-sided facial droop and left-sided weakness EXAM: CT HEAD WITHOUT CONTRAST TECHNIQUE: Contiguous axial images were obtained from the base of the skull through the vertex without intravenous contrast. COMPARISON:  None. FINDINGS: Brain: There is mild-to-moderate atrophy. There is Ruth intracranial mass, hemorrhage, extra-axial fluid collection, or midline shift. There is decreased attenuation felt to represent small vessel disease in the centra semiovale bilaterally. Ruth acute appearing infarct evident. Vascular: Ruth hyperdense vessel. There is calcification in each carotid siphon region. Skull: Bony calvarium appears intact. Sinuses/Orbits:  There is mucosal thickening in several ethmoid air cells. Other visualized paranasal sinuses are clear. Visualized orbits appear symmetric bilaterally. Other: Visualized mastoid air cells are clear. IMPRESSION: Atrophy with supratentorial small vessel disease. Ruth acute infarct evident. Ruth mass or hemorrhage. There are foci of carotid artery calcification. There is mucosal thickening in several ethmoid air cells. Electronically Signed   By: Lowella Grip III M.D.   On: 05/21/2018 09:37   Mr Brain Wo Contrast  Result Date: 05/21/2018 CLINICAL DATA:  Initial evaluation for acute left-sided weakness, facial droop. EXAM: MRI HEAD WITHOUT CONTRAST MRA HEAD WITHOUT CONTRAST TECHNIQUE: Multiplanar, multiecho pulse sequences of the brain and surrounding structures were obtained without intravenous contrast. Angiographic images of the head were obtained using MRA  technique without contrast. COMPARISON:  Prior CT from earlier the same day. FINDINGS: MRI HEAD FINDINGS Brain: Diffuse prominence of the CSF containing spaces compatible with generalized cerebral atrophy. Prominent patchy and confluent T2/FLAIR hyperintensity within the periventricular and deep white matter both cerebral hemispheres most consistent with chronic small vessel ischemic disease, moderate in nature. Chronic microvascular changes present within the pons. Few small remote bilateral cerebellar infarcts noted. Patchy small volume diffusion abnormality seen involving the right basal ganglia/corona radiata (series 5, image 31). Mild patchy involvement of the posterior right lentiform nucleus (series 5, image 27). Ruth associated mass effect or hemorrhage. Ruth other evidence for acute or subacute ischemia. Ruth acute intracranial hemorrhage. Ruth mass lesion, midline shift or mass effect. Diffuse ventricular prominence most like related global parenchymal volume loss of hydrocephalus. Underlying component of NPH would be difficult to exclude. Pituitary gland normal. Vascular: Major intracranial vascular flow voids maintained. Skull and upper cervical spine: Craniocervical junction normal. Ruth focal marrow replacing lesion. Scalp soft tissues unremarkable. Sinuses/Orbits: Globes and orbital soft tissues within normal limits. Paranasal sinuses are clear. Trace right mastoid effusion, of doubtful significance. Other: None. MRA HEAD FINDINGS ANTERIOR CIRCULATION: Examination technically degraded by motion artifact. Petrous segments widely patent bilaterally. Scattered atheromatous irregularity within the cavernous/supraclinoid ICAs without hemodynamically significant stenosis. Focal severe stenosis at the proximal right A1 segment (series 1039, image 12). A1 segments otherwise patent. Grossly normal anterior communicating artery. ACAs irregular but patent to their distal aspects without stenosis. M1 segments mildly  irregular as well without high-grade stenosis. Moderate atheromatous irregularity throughout the distal MCA branches which are fairly well perfused and symmetric. POSTERIOR CIRCULATION: Visualized vertebral arteries patent to the vertebrobasilar junction without stenosis. Right vertebral artery dominant. Basilar mildly irregular but widely patent to its distal aspect without stenosis. Superior cerebral arteries patent bilaterally. Both of the posterior cerebral artery supplied via the basilar. Short-segment moderate proximal left P2 and distal right P2 stenoses. PCAs patent to their distal aspects. Ruth intracranial aneurysm. IMPRESSION: MRI HEAD IMPRESSION: 1. Patchy small volume acute ischemic infarcts involving the right basal ganglia/corona radiata without mass effect. 2. Underlying moderate to advanced chronic microvascular ischemic disease, with few scatter remote bilateral cerebellar infarcts. 3. Diffuse ventricular prominence, most likely related to underlying global atrophy. A superimposed component of NPH would be difficult to exclude, and could be considered in the correct clinical setting. MRA HEAD IMPRESSION: 1. Negative intracranial MRA for large vessel occlusion. Ruth proximal high-grade or correctable stenosis identified. 2. Moderate diffuse atheromatous irregularity throughout the intracranial circulation. Electronically Signed   By: Jeannine Boga M.D.   On: 05/21/2018 15:43   US Carotid Bilateral (at [facility] Only)  Result Date: 05/21/2018 CLINICAL DATA:  Stroke.  Left-sided weakness.  Mouth  droop. EXAM: BILATERAL CAROTID DUPLEX ULTRASOUND TECHNIQUE: Pearline Cables scale imaging, color Doppler and duplex ultrasound were performed of bilateral carotid and vertebral arteries in the neck. COMPARISON:  None. FINDINGS: Criteria: Quantification of carotid stenosis is based on velocity parameters that correlate the residual internal carotid diameter with NASCET-based stenosis levels, using the diameter of  the distal internal carotid lumen as the denominator for stenosis measurement. The following velocity measurements were obtained: RIGHT ICA: 105 cm/sec CCA: 585 cm/sec SYSTOLIC ICA/CCA RATIO:  0.8 ECA: 112 cm/sec LEFT ICA: 86 cm/sec CCA: 98 cm/sec SYSTOLIC ICA/CCA RATIO:  0.9 ECA: 268 cm/sec RIGHT CAROTID ARTERY: Mild smooth soft plaque in the bulb. Low resistance internal carotid Doppler pattern. RIGHT VERTEBRAL ARTERY:  Antegrade. LEFT CAROTID ARTERY: There is smooth mixed plaque in the lower bulb. In the mid and upper bulb, there is irregular calcified plaque which extends into the external carotid artery. Low resistance internal carotid Doppler pattern is preserved. LEFT VERTEBRAL ARTERY:  Could not be visualized. IMPRESSION: Less than 50% stenosis in the right and left internal carotid arteries. Right vertebral artery is antegrade in flow. The left vertebral artery could not be identified. It may be occluded. Electronically Signed   By: Marybelle Killings M.D.   On: 05/21/2018 16:39   Mr Jodene Nam Head/brain ID Cm  Result Date: 05/21/2018 CLINICAL DATA:  Initial evaluation for acute left-sided weakness, facial droop. EXAM: MRI HEAD WITHOUT CONTRAST MRA HEAD WITHOUT CONTRAST TECHNIQUE: Multiplanar, multiecho pulse sequences of the brain and surrounding structures were obtained without intravenous contrast. Angiographic images of the head were obtained using MRA technique without contrast. COMPARISON:  Prior CT from earlier the same day. FINDINGS: MRI HEAD FINDINGS Brain: Diffuse prominence of the CSF containing spaces compatible with generalized cerebral atrophy. Prominent patchy and confluent T2/FLAIR hyperintensity within the periventricular and deep white matter both cerebral hemispheres most consistent with chronic small vessel ischemic disease, moderate in nature. Chronic microvascular changes present within the pons. Few small remote bilateral cerebellar infarcts noted. Patchy small volume diffusion abnormality  seen involving the right basal ganglia/corona radiata (series 5, image 31). Mild patchy involvement of the posterior right lentiform nucleus (series 5, image 27). Ruth associated mass effect or hemorrhage. Ruth other evidence for acute or subacute ischemia. Ruth acute intracranial hemorrhage. Ruth mass lesion, midline shift or mass effect. Diffuse ventricular prominence most like related global parenchymal volume loss of hydrocephalus. Underlying component of NPH would be difficult to exclude. Pituitary gland normal. Vascular: Major intracranial vascular flow voids maintained. Skull and upper cervical spine: Craniocervical junction normal. Ruth focal marrow replacing lesion. Scalp soft tissues unremarkable. Sinuses/Orbits: Globes and orbital soft tissues within normal limits. Paranasal sinuses are clear. Trace right mastoid effusion, of doubtful significance. Other: None. MRA HEAD FINDINGS ANTERIOR CIRCULATION: Examination technically degraded by motion artifact. Petrous segments widely patent bilaterally. Scattered atheromatous irregularity within the cavernous/supraclinoid ICAs without hemodynamically significant stenosis. Focal severe stenosis at the proximal right A1 segment (series 1039, image 12). A1 segments otherwise patent. Grossly normal anterior communicating artery. ACAs irregular but patent to their distal aspects without stenosis. M1 segments mildly irregular as well without high-grade stenosis. Moderate atheromatous irregularity throughout the distal MCA branches which are fairly well perfused and symmetric. POSTERIOR CIRCULATION: Visualized vertebral arteries patent to the vertebrobasilar junction without stenosis. Right vertebral artery dominant. Basilar mildly irregular but widely patent to its distal aspect without stenosis. Superior cerebral arteries patent bilaterally. Both of the posterior cerebral artery supplied via the basilar. Short-segment moderate proximal left P2 and  distal right P2 stenoses. PCAs  patent to their distal aspects. Ruth intracranial aneurysm. IMPRESSION: MRI HEAD IMPRESSION: 1. Patchy small volume acute ischemic infarcts involving the right basal ganglia/corona radiata without mass effect. 2. Underlying moderate to advanced chronic microvascular ischemic disease, with few scatter remote bilateral cerebellar infarcts. 3. Diffuse ventricular prominence, most likely related to underlying global atrophy. A superimposed component of NPH would be difficult to exclude, and could be considered in the correct clinical setting. MRA HEAD IMPRESSION: 1. Negative intracranial MRA for large vessel occlusion. Ruth proximal high-grade or correctable stenosis identified. 2. Moderate diffuse atheromatous irregularity throughout the intracranial circulation. Electronically Signed   By: Jeannine Boga M.D.   On: 05/21/2018 15:43      Management plans discussed with the Ruth Fuentes, family and they are in agreement.  CODE STATUS:     Code Status Orders  (From admission, onward)         Start     Ordered   05/21/18 1239  Full code  Continuous     05/21/18 1238        Code Status History    This Ruth Fuentes has a current code status but Ruth historical code status.      TOTAL TIME TAKING CARE OF THIS Ruth Fuentes: 40 minutes.    Henreitta Leber M.D on 05/22/2018 at 2:22 PM  Between 7am to 6pm - Pager - [phone]  After 6pm go to [email or web address] - Proofreader  [facility]  Office  [phone]  CC: Primary care physician; Ruth Fuentes, Ruth Fuentes

## 2018-05-22 NOTE — Evaluation (Signed)
Occupational Therapy Evaluation Patient Details Name: Ruth Fuentes MRN: [id number] DOB: 21-Apr-1951 Today's Date: 05/22/2018    History of Present Illness Pt. is a 67 y.o. female who presented to the ER with LUE weakness. Imaging revealed Patchy small volume acute Ischemic infarcts involving the Right BAsal Ganglia/corona radiate. 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 PMHx includes: Type 2 DM, HTN, Leukemia.   Clinical Impression   Pt. Is a 67 y.o. female who was admitted to [facility] with LUE weakness. Pt. reports that her LUE symptoms have resolved, and have returned to her baseline. Pt. LUE strength, sensation, proprioceptive awareness, motor control, and [facility] skills are WFL, and not affecting engagement in ADL tasks. OT skilled services are not warranted at this time. No follow-up OT services are indicated. Will complete the order, and rescreen as appropriate.    Follow Up Recommendations  No OT follow up    Equipment Recommendations       Recommendations for Other Services       Precautions / Restrictions Restrictions Weight Bearing Restrictions: No             ADL either performed or assessed with clinical judgement   ADL Overall ADL's : Needs assistance/impaired Eating/Feeding: Set up;Independent   Grooming: Set up;Independent   Upper Body Bathing: Set up;Independent   Lower Body Bathing: Set up;Supervison/ safety   Upper Body Dressing : Set up;Independent   Lower Body Dressing: Set up;Supervision/safety                       Vision Baseline Vision/History: Wears glasses Wears Glasses: Reading only Patient Visual Report: No change from baseline       Perception     Praxis      Pertinent Vitals/Pain Pain Assessment: No/denies pain     Hand Dominance Right   Extremity/Trunk Assessment Upper Extremity Assessment Upper Extremity Assessment: Overall WFL for tasks  assessed           Communication Communication Communication: No difficulties   Cognition Arousal/Alertness: Awake/alert Behavior During Therapy: WFL for tasks assessed/performed Overall Cognitive Status: History of cognitive impairments - at baseline                                     General Comments       Exercises     Shoulder Instructions      Home Living Family/patient expects to be discharged to:: Private residence   Available Help at Discharge: Family Type of Home: House Home Access: Stairs to enter Technical brewer of Steps: 1   Home Layout: Two level                      Lives With: Daughter    Prior Functioning/Environment Level of Independence: Needs assistance    ADL's / Homemaking Assistance Needed: Pt. was independent with ADLs, and light IADL tasks. Pt. no longer drives.            OT Problem List:        OT Treatment/Interventions:      OT Goals(Current goals can be found in the care plan section) Acute Rehab OT Goals Patient Stated Goal: To return home with her daughter OT Goal Formulation: With patient Potential to Achieve Goals: Good  OT Frequency:     Barriers to D/C:  Co-evaluation              AM-PAC PT "6 Clicks" Daily Activity     Outcome Measure Help from another person eating meals?: None Help from another person taking care of personal grooming?: None Help from another person toileting, which includes using toliet, bedpan, or urinal?: A Little Help from another person bathing (including washing, rinsing, drying)?: None Help from another person to put on and taking off regular upper body clothing?: None Help from another person to put on and taking off regular lower body clothing?: None 6 Click Score: 23   End of Session    Activity Tolerance: Patient tolerated treatment well Patient left: in bed;with call bell/phone within reach;with bed alarm set;with family/visitor  present                   Time: 7341-9379 OT Time Calculation (min): 22 min Charges:  OT General Charges $OT Visit: 1 Visit OT Evaluation $OT Eval Moderate Complexity: 1 Mod  Ruth Carina, MS, OTR/L  Ruth Fuentes 05/22/2018, 11:44 AM

## 2018-05-22 NOTE — Evaluation (Signed)
Physical Therapy Evaluation Patient Details Name: Ruth Fuentes MRN: [id number] DOB: 1951/06/26 Today's Date: 05/22/2018   History of Present Illness  Pt. is a 67 y.o. female who presented to the ER with LUE weakness. Imaging revealed Patchy small volume acute Ischemic infarcts involving the Right BAsal Ganglia/corona radiate. 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 PMHx includes: Type 2 DM, HTN, Leukemia.  Clinical Impression  Pt is a pleasant 67 year old female who was admitted for + R BG CVA. Pt motivated to work with therapy. Planning for dc this date. Pt demonstrates all bed mobility/transfers/ambulation at baseline level. Pt does not require any further PT needs at this time. Modified DGI at 12/12 demonstrating good balance and safety without AD. Pt will be dc in house and does not require follow up. RN aware. Will dc current orders.      Follow Up Recommendations No PT follow up    Equipment Recommendations  None recommended by PT    Recommendations for Other Services       Precautions / Restrictions Precautions Precautions: Fall Restrictions Weight Bearing Restrictions: No      Mobility  Bed Mobility Overal bed mobility: Modified Independent             General bed mobility comments: safe technique with all mobility.  Transfers Overall transfer level: Modified independent Equipment used: None             General transfer comment: use rail for initial standing, however no AD use for transfer. Upright posture noted  Ambulation/Gait Ambulation/Gait assistance: Supervision Gait Distance (Feet): 130 Feet Assistive device: None Gait Pattern/deviations: Step-through pattern     General Gait Details: ambulated with safe technique with reciprocal gait pattern and upright posture. Able to carry conversation during mobility efforts and no LOB noted.  Stairs Stairs: Yes Stairs  assistance: Min guard Stair Management: One rail Left Number of Stairs: 7 General stair comments: up/down 7 stairs with safe step-to gait pattern.   Wheelchair Mobility    Modified Rankin (Stroke Patients Only)       Balance Overall balance assessment: Independent                               Standardized Balance Assessment Standardized Balance Assessment : Dynamic Gait Index(modified DGI 12/12)   Dynamic Gait Index Level Surface: Normal Change in Gait Speed: Normal Gait with Horizontal Head Turns: Normal Gait with Vertical Head Turns: Normal       Pertinent Vitals/Pain Pain Assessment: No/denies pain    Home Living Family/patient expects to be discharged to:: Private residence Living Arrangements: Children(daughter) Available Help at Discharge: Family;Available PRN/intermittently Type of Home: House Home Access: Stairs to enter   [employer] of Steps: 1 Home Layout: Two level Home Equipment: Walker - 2 wheels      Prior Function Level of Independence: Needs assistance      ADL's / Homemaking Assistance Needed: Pt. was independent with ADLs, and light IADL tasks. Pt. no longer drives.  Comments: no use of AD at baseline     Hand Dominance   Dominant Hand: Right    Extremity/Trunk Assessment   Upper Extremity Assessment Upper Extremity Assessment: Overall WFL for tasks assessed    Lower Extremity Assessment Lower Extremity Assessment: Overall WFL for tasks assessed       Communication   Communication: No difficulties  Cognition Arousal/Alertness: Awake/alert Behavior During Therapy: [facility]  for tasks assessed/performed Overall Cognitive Status: History of cognitive impairments - at baseline                                        General Comments      Exercises     Assessment/Plan    PT Assessment Patent does not need any further PT services  PT Problem List         PT Treatment Interventions       PT Goals (Current goals can be found in the Care Plan section)  Acute Rehab PT Goals Patient Stated Goal: To return home with her daughter PT Goal Formulation: All assessment and education complete, DC therapy Time For Goal Achievement: 05/22/18 Potential to Achieve Goals: Good    Frequency     Barriers to discharge        Co-evaluation               AM-PAC PT "6 Clicks" Daily Activity  Outcome Measure Difficulty turning over in bed (including adjusting bedclothes, sheets and blankets)?: None Difficulty moving from lying on back to sitting on the side of the bed? : None Difficulty sitting down on and standing up from a chair with arms (e.g., wheelchair, bedside commode, etc,.)?: None Help needed moving to and from a bed to chair (including a wheelchair)?: None Help needed walking in hospital room?: None Help needed climbing 3-5 steps with a railing? : None 6 Click Score: 24    End of Session Equipment Utilized During Treatment: Gait belt Activity Tolerance: Patient tolerated treatment well Patient left: in bed(sitting at EOB with RN entering room) Nurse Communication: Mobility status PT Visit Diagnosis: Muscle weakness (generalized) (M62.81)    Time: 7614-7092 PT Time Calculation (min) (ACUTE ONLY): 16 min   Charges:   PT Evaluation $PT Eval Low Complexity: 1 Low PT Treatments $Gait Training: 8-22 mins        Greggory Stallion, PT, DPT [phone]   Kisean Rollo 05/22/2018, 2:58 PM

## 2018-05-22 NOTE — Plan of Care (Signed)
  Problem: Education: Goal: Knowledge of secondary prevention will improve Outcome: Adequate for Discharge Goal: Knowledge of patient specific risk factors addressed and post discharge goals established will improve Outcome: Adequate for Discharge Goal: Individualized Educational Video(s) Outcome: Adequate for Discharge   Problem: Coping: Goal: Will verbalize positive feelings about self Outcome: Adequate for Discharge Goal: Will identify appropriate support needs Outcome: Adequate for Discharge   Problem: Health Behavior/Discharge Planning: Goal: Ability to manage health-related needs will improve Outcome: Adequate for Discharge   Problem: Self-Care: Goal: Ability to participate in self-care as condition permits will improve Outcome: Adequate for Discharge Goal: Verbalization of feelings and concerns over difficulty with self-care will improve Outcome: Adequate for Discharge Goal: Ability to communicate needs accurately will improve Outcome: Adequate for Discharge   Problem: Nutrition: Goal: Risk of aspiration will decrease Outcome: Adequate for Discharge Goal: Dietary intake will improve Outcome: Adequate for Discharge   Problem: Education: Goal: Knowledge of General Education information will improve Description Including pain rating scale, medication(s)/side effects and non-pharmacologic comfort measures Outcome: Adequate for Discharge   Problem: Health Behavior/Discharge Planning: Goal: Ability to manage health-related needs will improve Outcome: Adequate for Discharge   Problem: Clinical Measurements: Goal: Ability to maintain clinical measurements within normal limits will improve Outcome: Adequate for Discharge Goal: Will remain free from infection Outcome: Adequate for Discharge Goal: Diagnostic test results will improve Outcome: Adequate for Discharge Goal: Respiratory complications will improve Outcome: Adequate for Discharge Goal: Cardiovascular  complication will be avoided Outcome: Adequate for Discharge   Problem: Activity: Goal: Risk for activity intolerance will decrease Outcome: Adequate for Discharge

## 2018-05-22 NOTE — Progress Notes (Signed)
Patient is discharged home with family, discharge instructions given to patient/family, teaching back is used to verbalized understanding. All question answered and concerns addressed. Patient home medication Tasigna was given back to patient from pharmacy.

## 2018-05-22 NOTE — Progress Notes (Signed)
Subjective: Patient without new neurological complaints.    Objective: Current vital signs: BP (!) 141/91   Pulse 89   Temp 98.5 F (36.9 C) (Oral)   Resp 18   Ht 5' 4.57" (1.64 m)   Wt 78.8 kg   SpO2 96%   BMI 29.30 kg/m  Vital signs in last 24 hours: Temp:  [98 F (36.7 C)-98.7 F (37.1 C)] 98.5 F (36.9 C) (09/13 1231) Pulse Rate:  [73-89] 89 (09/13 1231) Resp:  [17-18] 18 (09/13 0313) BP: (117-162)/(57-91) 141/91 (09/13 1231) SpO2:  [94 %-98 %] 96 % (09/13 1231)  Intake/Output from previous day: 09/12 0701 - 09/13 0700 In: 150 [P.O.:150] Out: -  Intake/Output this shift: Total I/O In: 240 [P.O.:240] Out: -  Nutritional status:  Diet Order            Diet - low sodium heart healthy        Diet heart healthy/carb modified Room service appropriate? Yes; Fluid consistency: Thin  Diet effective now              Neurologic Exam: Mental Status: Alert, oriented, thought content appropriate. Speech mildly slurred without evidence of aphasia. Able to follow 3 step commands without difficulty.  Cranial Nerves: II: Discs flat bilaterally; Visual fields grossly normal, pupils equal, round, reactive to light and accommodation III,IV, VI: ptosis not present, extra-ocular motions intact bilaterally V,VII: smile asymmetric,Left facial droop,facial light touch sensationintact VIII: hearing normal bilaterally IX,X: gag reflex present XI: bilateral shoulder shrug XII: midline tongue extension Motor: Right :Upper extremity 5/5Without pronator driftLeft: Upper extremity 4+/5 without pronator drift Right:Lower extremity 5/5Left: Lower extremity 4+/5 Tone and bulk:normal tone throughout; no atrophy noted Sensory: Pinprick and light touchdecreased on the right  Lab Results: Basic Metabolic Panel: Recent Labs  Lab 05/21/18 0950  NA 140  K 5.0  CL 106  CO2 27  GLUCOSE 106*  BUN 18  CREATININE 1.62*   CALCIUM 9.3    Liver Function Tests: Recent Labs  Lab 05/21/18 0950  AST 41  ALT 55*  ALKPHOS 195*  BILITOT 1.3*  PROT 7.2  ALBUMIN 3.6   No results for input(s): LIPASE, AMYLASE in the last 168 hours. No results for input(s): AMMONIA in the last 168 hours.  CBC: Recent Labs  Lab 05/21/18 0950  WBC 10.3  NEUTROABS 7.8*  HGB 12.2  HCT 36.3  MCV 94.1  PLT 210    Cardiac Enzymes: Recent Labs  Lab 05/21/18 0950  TROPONINI <0.03    Lipid Panel: Recent Labs  Lab 05/22/18 0623  CHOL 223*  TRIG 99  HDL 51  CHOLHDL 4.4  VLDL 20  LDLCALC 152*    CBG: Recent Labs  Lab 05/21/18 1649 05/21/18 2214 05/22/18 0742 05/22/18 1238  GLUCAP 77 136* 111* 109*    Microbiology: No results found for this or any previous visit.  Coagulation Studies: Recent Labs    05/21/18 0950  LABPROT 12.1  INR 0.90    Imaging: Ct Head Wo Contrast  Result Date: 05/21/2018 CLINICAL DATA:  Left-sided facial droop and left-sided weakness EXAM: CT HEAD WITHOUT CONTRAST TECHNIQUE: Contiguous axial images were obtained from the base of the skull through the vertex without intravenous contrast. COMPARISON:  None. FINDINGS: Brain: There is mild-to-moderate atrophy. There is no intracranial mass, hemorrhage, extra-axial fluid collection, or midline shift. There is decreased attenuation felt to represent small vessel disease in the centra semiovale bilaterally. No acute appearing infarct evident. Vascular: No hyperdense vessel. There is calcification in  each carotid siphon region. Skull: Bony calvarium appears intact. Sinuses/Orbits: There is mucosal thickening in several ethmoid air cells. Other visualized paranasal sinuses are clear. Visualized orbits appear symmetric bilaterally. Other: Visualized mastoid air cells are clear. IMPRESSION: Atrophy with supratentorial small vessel disease. No acute infarct evident. No mass or hemorrhage. There are foci of carotid artery calcification. There is  mucosal thickening in several ethmoid air cells. Electronically Signed   By: Lowella Grip III M.D.   On: 05/21/2018 09:37   Mr Brain Wo Contrast  Result Date: 05/21/2018 CLINICAL DATA:  Initial evaluation for acute left-sided weakness, facial droop. EXAM: MRI HEAD WITHOUT CONTRAST MRA HEAD WITHOUT CONTRAST TECHNIQUE: Multiplanar, multiecho pulse sequences of the brain and surrounding structures were obtained without intravenous contrast. Angiographic images of the head were obtained using MRA technique without contrast. COMPARISON:  Prior CT from earlier the same day. FINDINGS: MRI HEAD FINDINGS Brain: Diffuse prominence of the CSF containing spaces compatible with generalized cerebral atrophy. Prominent patchy and confluent T2/FLAIR hyperintensity within the periventricular and deep white matter both cerebral hemispheres most consistent with chronic small vessel ischemic disease, moderate in nature. Chronic microvascular changes present within the pons. Few small remote bilateral cerebellar infarcts noted. Patchy small volume diffusion abnormality seen involving the right basal ganglia/corona radiata (series 5, image 31). Mild patchy involvement of the posterior right lentiform nucleus (series 5, image 27). No associated mass effect or hemorrhage. No other evidence for acute or subacute ischemia. No acute intracranial hemorrhage. No mass lesion, midline shift or mass effect. Diffuse ventricular prominence most like related global parenchymal volume loss of hydrocephalus. Underlying component of NPH would be difficult to exclude. Pituitary gland normal. Vascular: Major intracranial vascular flow voids maintained. Skull and upper cervical spine: Craniocervical junction normal. No focal marrow replacing lesion. Scalp soft tissues unremarkable. Sinuses/Orbits: Globes and orbital soft tissues within normal limits. Paranasal sinuses are clear. Trace right mastoid effusion, of doubtful significance. Other: None.  MRA HEAD FINDINGS ANTERIOR CIRCULATION: Examination technically degraded by motion artifact. Petrous segments widely patent bilaterally. Scattered atheromatous irregularity within the cavernous/supraclinoid ICAs without hemodynamically significant stenosis. Focal severe stenosis at the proximal right A1 segment (series 1039, image 12). A1 segments otherwise patent. Grossly normal anterior communicating artery. ACAs irregular but patent to their distal aspects without stenosis. M1 segments mildly irregular as well without high-grade stenosis. Moderate atheromatous irregularity throughout the distal MCA branches which are fairly well perfused and symmetric. POSTERIOR CIRCULATION: Visualized vertebral arteries patent to the vertebrobasilar junction without stenosis. Right vertebral artery dominant. Basilar mildly irregular but widely patent to its distal aspect without stenosis. Superior cerebral arteries patent bilaterally. Both of the posterior cerebral artery supplied via the basilar. Short-segment moderate proximal left P2 and distal right P2 stenoses. PCAs patent to their distal aspects. No intracranial aneurysm. IMPRESSION: MRI HEAD IMPRESSION: 1. Patchy small volume acute ischemic infarcts involving the right basal ganglia/corona radiata without mass effect. 2. Underlying moderate to advanced chronic microvascular ischemic disease, with few scatter remote bilateral cerebellar infarcts. 3. Diffuse ventricular prominence, most likely related to underlying global atrophy. A superimposed component of NPH would be difficult to exclude, and could be considered in the correct clinical setting. MRA HEAD IMPRESSION: 1. Negative intracranial MRA for large vessel occlusion. No proximal high-grade or correctable stenosis identified. 2. Moderate diffuse atheromatous irregularity throughout the intracranial circulation. Electronically Signed   By: Jeannine Boga M.D.   On: 05/21/2018 15:43   US Carotid Bilateral (at  [facility])  Result  Date: 05/21/2018 CLINICAL DATA:  Stroke.  Left-sided weakness.  Mouth droop. EXAM: BILATERAL CAROTID DUPLEX ULTRASOUND TECHNIQUE: Pearline Cables scale imaging, color Doppler and duplex ultrasound were performed of bilateral carotid and vertebral arteries in the neck. COMPARISON:  None. FINDINGS: Criteria: Quantification of carotid stenosis is based on velocity parameters that correlate the residual internal carotid diameter with NASCET-based stenosis levels, using the diameter of the distal internal carotid lumen as the denominator for stenosis measurement. The following velocity measurements were obtained: RIGHT ICA: 105 cm/sec CCA: 712 cm/sec SYSTOLIC ICA/CCA RATIO:  0.8 ECA: 112 cm/sec LEFT ICA: 86 cm/sec CCA: 98 cm/sec SYSTOLIC ICA/CCA RATIO:  0.9 ECA: 268 cm/sec RIGHT CAROTID ARTERY: Mild smooth soft plaque in the bulb. Low resistance internal carotid Doppler pattern. RIGHT VERTEBRAL ARTERY:  Antegrade. LEFT CAROTID ARTERY: There is smooth mixed plaque in the lower bulb. In the mid and upper bulb, there is irregular calcified plaque which extends into the external carotid artery. Low resistance internal carotid Doppler pattern is preserved. LEFT VERTEBRAL ARTERY:  Could not be visualized. IMPRESSION: Less than 50% stenosis in the right and left internal carotid arteries. Right vertebral artery is antegrade in flow. The left vertebral artery could not be identified. It may be occluded. Electronically Signed   By: Marybelle Killings M.D.   On: 05/21/2018 16:39   Mr Jodene Nam Head/brain WP Cm  Result Date: 05/21/2018 CLINICAL DATA:  Initial evaluation for acute left-sided weakness, facial droop. EXAM: MRI HEAD WITHOUT CONTRAST MRA HEAD WITHOUT CONTRAST TECHNIQUE: Multiplanar, multiecho pulse sequences of the brain and surrounding structures were obtained without intravenous contrast. Angiographic images of the head were obtained using MRA technique without contrast. COMPARISON:  Prior CT from earlier the  same day. FINDINGS: MRI HEAD FINDINGS Brain: Diffuse prominence of the CSF containing spaces compatible with generalized cerebral atrophy. Prominent patchy and confluent T2/FLAIR hyperintensity within the periventricular and deep white matter both cerebral hemispheres most consistent with chronic small vessel ischemic disease, moderate in nature. Chronic microvascular changes present within the pons. Few small remote bilateral cerebellar infarcts noted. Patchy small volume diffusion abnormality seen involving the right basal ganglia/corona radiata (series 5, image 31). Mild patchy involvement of the posterior right lentiform nucleus (series 5, image 27). No associated mass effect or hemorrhage. No other evidence for acute or subacute ischemia. No acute intracranial hemorrhage. No mass lesion, midline shift or mass effect. Diffuse ventricular prominence most like related global parenchymal volume loss of hydrocephalus. Underlying component of NPH would be difficult to exclude. Pituitary gland normal. Vascular: Major intracranial vascular flow voids maintained. Skull and upper cervical spine: Craniocervical junction normal. No focal marrow replacing lesion. Scalp soft tissues unremarkable. Sinuses/Orbits: Globes and orbital soft tissues within normal limits. Paranasal sinuses are clear. Trace right mastoid effusion, of doubtful significance. Other: None. MRA HEAD FINDINGS ANTERIOR CIRCULATION: Examination technically degraded by motion artifact. Petrous segments widely patent bilaterally. Scattered atheromatous irregularity within the cavernous/supraclinoid ICAs without hemodynamically significant stenosis. Focal severe stenosis at the proximal right A1 segment (series 1039, image 12). A1 segments otherwise patent. Grossly normal anterior communicating artery. ACAs irregular but patent to their distal aspects without stenosis. M1 segments mildly irregular as well without high-grade stenosis. Moderate atheromatous  irregularity throughout the distal MCA branches which are fairly well perfused and symmetric. POSTERIOR CIRCULATION: Visualized vertebral arteries patent to the vertebrobasilar junction without stenosis. Right vertebral artery dominant. Basilar mildly irregular but widely patent to its distal aspect without stenosis. Superior cerebral arteries patent bilaterally. Both of the posterior cerebral  artery supplied via the basilar. Short-segment moderate proximal left P2 and distal right P2 stenoses. PCAs patent to their distal aspects. No intracranial aneurysm. IMPRESSION: MRI HEAD IMPRESSION: 1. Patchy small volume acute ischemic infarcts involving the right basal ganglia/corona radiata without mass effect. 2. Underlying moderate to advanced chronic microvascular ischemic disease, with few scatter remote bilateral cerebellar infarcts. 3. Diffuse ventricular prominence, most likely related to underlying global atrophy. A superimposed component of NPH would be difficult to exclude, and could be considered in the correct clinical setting. MRA HEAD IMPRESSION: 1. Negative intracranial MRA for large vessel occlusion. No proximal high-grade or correctable stenosis identified. 2. Moderate diffuse atheromatous irregularity throughout the intracranial circulation. Electronically Signed   By: Jeannine Boga M.D.   On: 05/21/2018 15:43    Medications:  I have reviewed the patient's current medications. Scheduled: . aspirin  300 mg Rectal Daily   Or  . aspirin  325 mg Oral Daily  . atorvastatin  40 mg Oral q1800  . enoxaparin (LOVENOX) injection  40 mg Subcutaneous Q24H  . insulin aspart  0-9 Units Subcutaneous TID WC  . linagliptin  5 mg Oral Daily  . nilotinib  300 mg Oral Q12H    Assessment/Plan: Patient without new neurological complaints.  On ASA and a statin. Echocardiogram shows no cardiac source of emboli with an EF of 60-65%.  A1c 6.4, LDL 152.  Recommendations: 1.  Continue ASA and statin 2.   PT/OT as recommended 3.  Patient to follow up with neurology on an outpatient basis  No further neurologic intervention is recommended at this time.  If further questions arise, please call or page at that time.  Thank you for allowing neurology to participate in the care of this patient.    LOS: 1 day   Alexis Goodell, MD Neurology [phone] 05/22/2018  2:56 PM

## 2018-05-22 NOTE — Evaluation (Signed)
Speech Language Pathology Evaluation Patient Details Name: Ruth Fuentes MRN: [id number] DOB: Apr 03, 1951 Today's Date: 05/22/2018 Time: 1016-1100 SLP Time Calculation (min) (ACUTE ONLY): 44 min  Problem List:  Patient Active Problem List   Diagnosis Date Noted  . CVA (cerebral vascular accident) ([place]) 05/21/2018   Past Medical History:  Past Medical History:  Diagnosis Date  . Diabetes mellitus without complication ([place])   . Hypertension   . Leukemia ([place])   . Leukemia [facility])    Past Surgical History:  Past Surgical History:  Procedure Laterality Date  . LUNG SURGERY     HPI:  Ruth Fuentes  is a 67 y.o. female with a known history of diabetes type 2, essential hypertension, leukemia who is currently visiting her family here presenting with complaint of left upper extremity left lower extremity weakness associated with facial droop.  Patient states that she was in her normal state of health until yesterday morning when she developed the symptoms she started having difficulty with walking.  She also stated that yesterday she was trying to drink soda and she started coughing.  Patient feels unsteady on her feet.  Work-up in the ER with a CT scan of the head is negative but there is high suspicion for stroke.   Assessment / Plan / Recommendation Clinical Impression   Patient scored a 19/30 on [place], indicating mild-marked cognitive communication deficits in visuospatial skills, attention, abstraction, and delayed recall. HOWEVER, visuospatial score may be affected d/t visual deficits even with use of eye glasses. Daughter stated that patient is "at baseline" and receives current assistance from daughters at home with daily tasks. Patient and daughter reported independence with medication and financial management, however does not drive anymore d/t vision impairment. Patient with mild awareness to recall deficits and verbalized utilizing writing strategy at home for enhanced recall  skills for birthdays, appointments, etc. Daughter inquired about recommended cognitive tools to utilize at home for general cognitive stimulation. SLP recommended continued independence in functional tasks (such as daily routine, medications, finances, decision-making) in order to continue cognitive stimulation in home environment. However, if functional abilities changes in home environment, recommendation to potentially seek out rehab services (PT/OT/ST). Daughter verbalized understanding and agreement. At this time, ST services for cognition not recommended d/t patients baseline status and assistance provided daily at home.     SLP Assessment  SLP Recommendation/Assessment: Patient does not need any further Speech Lanaguage Pathology Services SLP Visit Diagnosis: Cognitive communication deficit (R41.841)    Follow Up Recommendations  None    Frequency and Duration   N/A        SLP Evaluation Cognition  Overall Cognitive Status: History of cognitive impairments - at baseline Arousal/Alertness: Awake/alert Orientation Level: Oriented X4(Able to state correct month/year, however unable to verbalize correct day (off by 3 days)) Attention: Sustained(Diffulcty with higher level attention tasks) Sustained Attention: Appears intact Memory: Impaired Awareness: Appears intact(Patient is aware of occsaional recall deficits) Executive Function: (Difficult to assess executive function d/t visual impairments) Safety/Judgment: Appears intact(Did not demonstrate impulsive behaviors during session; did state that she does not currently drive for the safety of others on the road)       Comprehension  Auditory Comprehension Overall Auditory Comprehension: Appears within functional limits for tasks assessed Yes/No Questions: Within Functional Limits Commands: Within Functional Limits Conversation: Complex Visual Recognition/Discrimination Discrimination: Exceptions to WFL(Patient demonstrated  difficulty visually recognizing words on whiteboard at a distance (even with glasses on) d/t visual impairments) Reading Comprehension Effective Techniques: Eye glasses;Large print  Expression Expression Primary Mode of Expression: Verbal Verbal Expression Overall Verbal Expression: Appears within functional limits for tasks assessed Initiation: No impairment Automatic Speech: Name;Social Response Level of Generative/Spontaneous Verbalization: Conversation Repetition: No impairment Naming: No impairment Written Expression Dominant Hand: Right         Loni Beckwith, M.S. CCC-SLP Speech-Language Pathologist                    Loni Beckwith 05/22/2018, 11:12 AM

## 2018-05-22 NOTE — Plan of Care (Signed)
  Problem: Education: Goal: Knowledge of disease or condition will improve Outcome: Progressing Goal: Knowledge of secondary prevention will improve Outcome: Progressing Goal: Knowledge of patient specific risk factors addressed and post discharge goals established will improve Outcome: Progressing Goal: Individualized Educational Video(s) Outcome: Progressing   Problem: Coping: Goal: Will verbalize positive feelings about self Outcome: Progressing Goal: Will identify appropriate support needs Outcome: Progressing   Problem: Health Behavior/Discharge Planning: Goal: Ability to manage health-related needs will improve Outcome: Progressing   Problem: Nutrition: Goal: Risk of aspiration will decrease Outcome: Progressing Goal: Dietary intake will improve Outcome: Progressing   Problem: Education: Goal: Knowledge of General Education information will improve Description Including pain rating scale, medication(s)/side effects and non-pharmacologic comfort measures Outcome: Progressing   Problem: Health Behavior/Discharge Planning: Goal: Ability to manage health-related needs will improve Outcome: Progressing   Problem: Clinical Measurements: Goal: Ability to maintain clinical measurements within normal limits will improve Outcome: Progressing Goal: Will remain free from infection Outcome: Progressing Goal: Diagnostic test results will improve Outcome: Progressing Goal: Respiratory complications will improve Outcome: Progressing Goal: Cardiovascular complication will be avoided Outcome: Progressing   Problem: Activity: Goal: Risk for activity intolerance will decrease Outcome: Progressing

## 2018-05-25 ENCOUNTER — Telehealth: Payer: Self-pay

## 2018-05-25 NOTE — Telephone Encounter (Signed)
Flagged on EMMI report for not having a follow up appointment.  First attempt to reach patient made 05/25/18 at 2:23pm, however unable to reach patient.  Left voicemail encouraging callback. Will attempt at later time.

## 2019-09-18 IMAGING — MR MR HEAD W/O CM
10 of 13 series · 34 of 48 positions shown · non-contrast
Comparison: Prior CT from earlier the same day.

CLINICAL DATA: Initial evaluation for acute left-sided weakness,
facial droop.

EXAM:
MRI HEAD WITHOUT CONTRAST
MRA HEAD WITHOUT CONTRAST
TECHNIQUE: Multiplanar, multiecho pulse sequences of the brain and surrounding
structures were obtained without intravenous contrast. Angiographic
images of the head were obtained using MRA technique without
contrast.

[Series 5: ax dwi_tracew · axial · 3.0mm · 0.73mm/px · z∈[-58,+88]mm · 3 of 50 slices shown (1 of 2)]
[im 1/50]
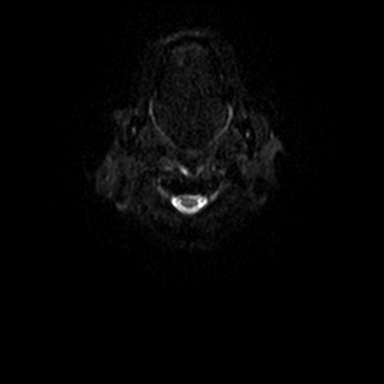
[im 25/50]
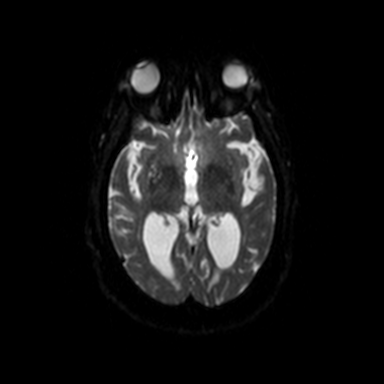
[im 50/50]
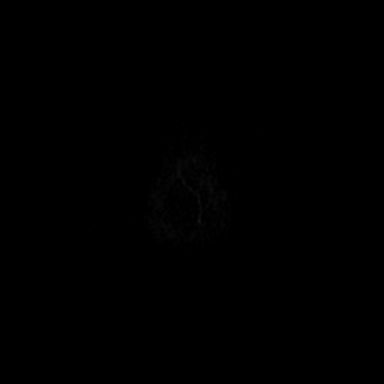

[Series 5: ax dwi_tracew · axial · 3.0mm · 0.73mm/px · z∈[-58,+88]mm · 3 of 50 slices shown (2 of 2)]
[im 1/50]
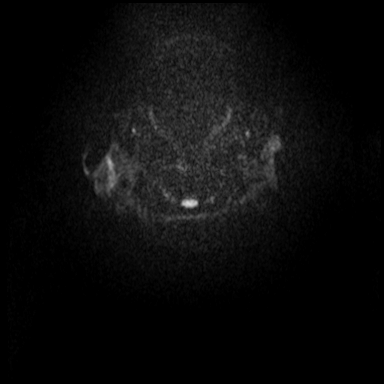
[im 25/50]
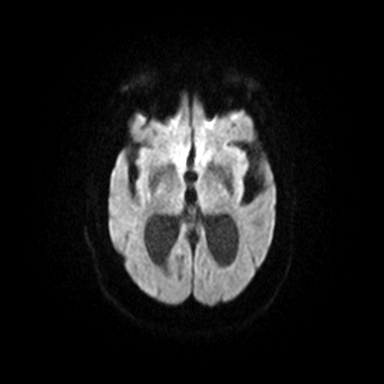
[im 50/50]
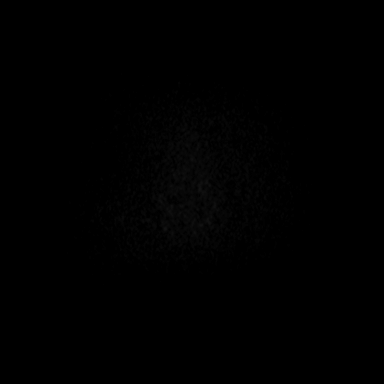

[Series 6: ax dwi_adc · axial · 3.0mm · 0.73mm/px · z∈[-58,+88]mm · 4 of 50 slices shown]
[im 1/50]
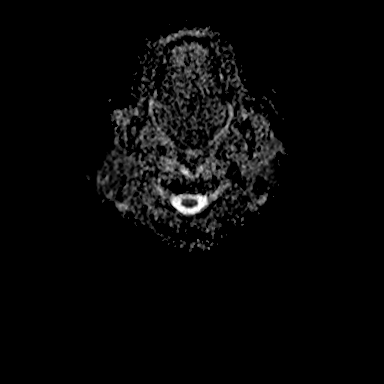
[im 17/50]
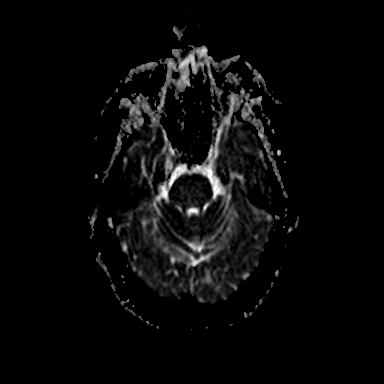
[im 33/50]
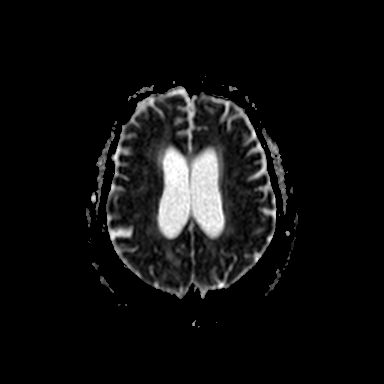
[im 50/50]
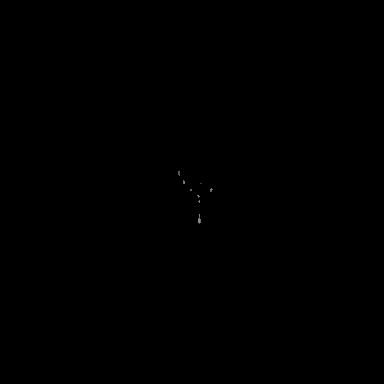

[Series 7: cor dwi_tracew · coronal · 5.0mm · 0.60mm/px · 3 of 35 slices shown (1 of 2)]
[im 1/35]
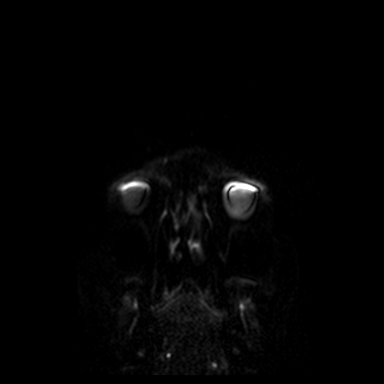
[im 18/35]
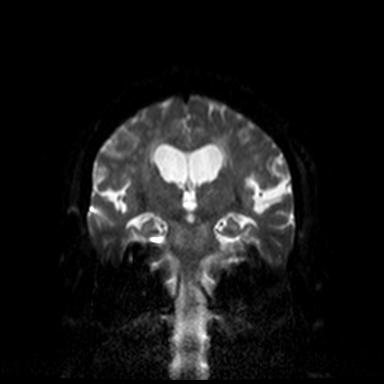
[im 35/35]
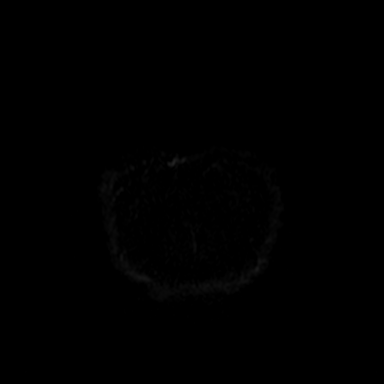

[Series 7: cor dwi_tracew · coronal · 5.0mm · 0.60mm/px · 3 of 35 slices shown (2 of 2)]
[im 1/35]
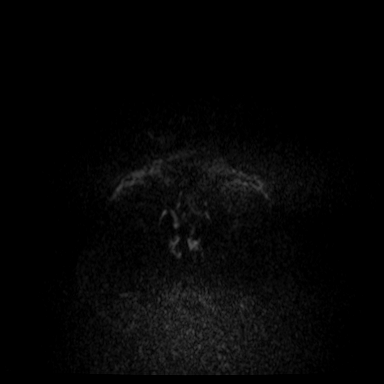
[im 18/35]
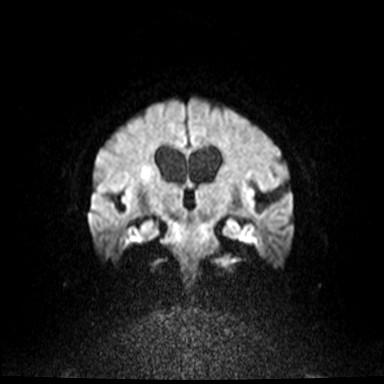
[im 35/35]
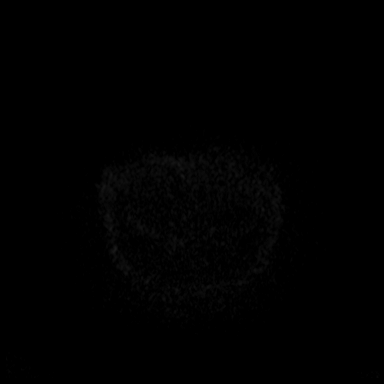

[Series 9: T1 · sagittal · 5.0mm · 0.62mm/px · 2 of 20 slices shown (1 of 2)]
[im 1/20]
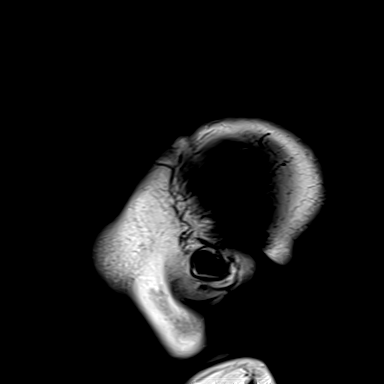
[im 20/20]
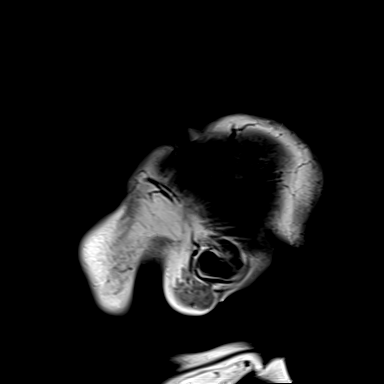

[Series 10: T2 · axial · 5.0mm · 0.53mm/px · z∈[-38,+100]mm · 2 of 24 slices shown (1 of 2)]
[im 1/24]
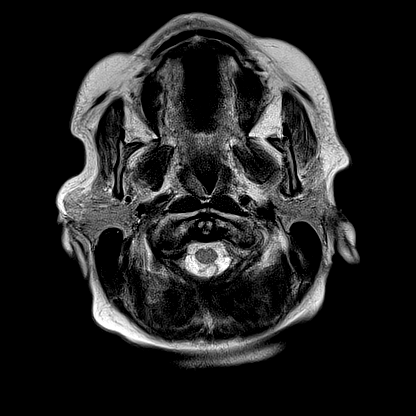
[im 24/24]
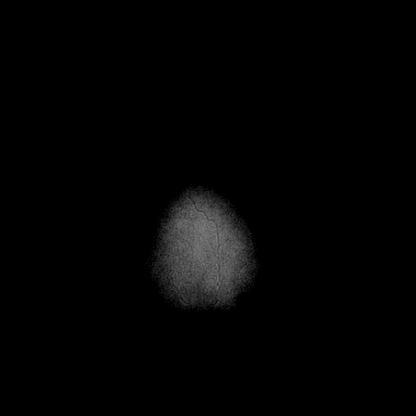

[Series 13: FLAIR · axial · 3.0mm · 0.53mm/px · z∈[-50,+112]mm · 4 of 55 slices shown]
[im 1/55]
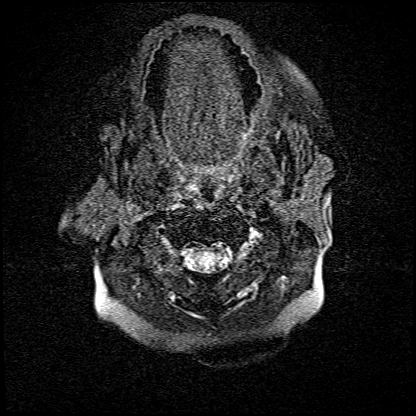
[im 19/55]
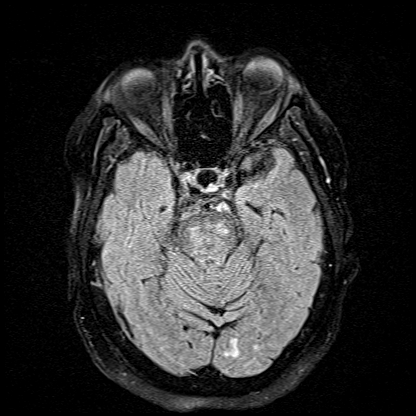
[im 37/55]
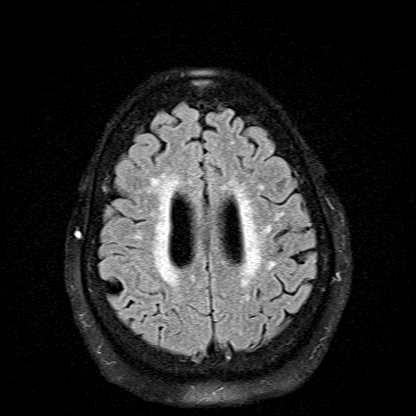
[im 55/55]
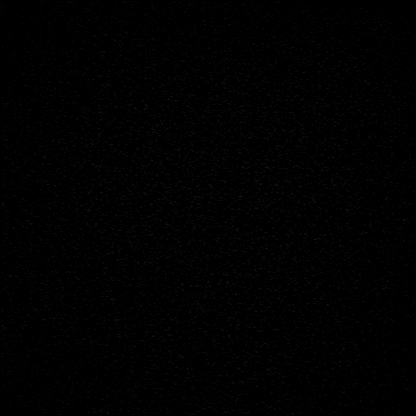

[Series 14: T1 · axial · 1.0mm · 0.98mm/px · z∈[-38,+105]mm · 8 of 144 slices shown (2 of 2)]
[im 1/144]
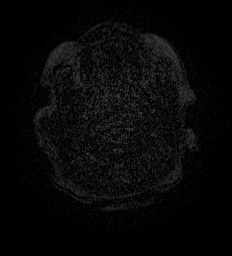
[im 29/144]
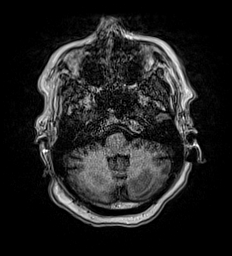
[im 43/144]
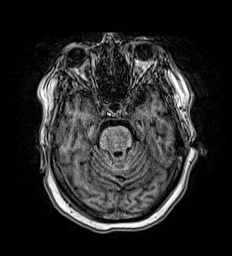
[im 58/144]
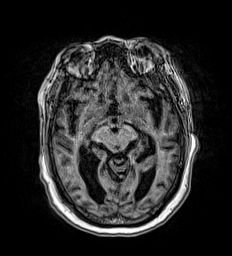
[im 86/144]
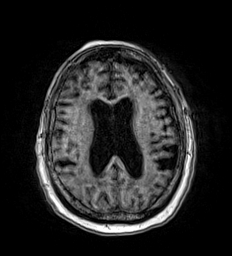
[im 101/144]
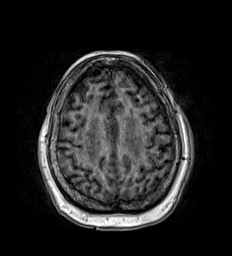
[im 115/144]
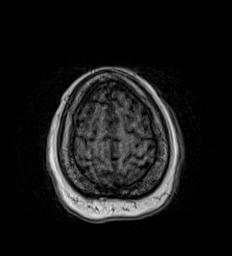
[im 144/144]
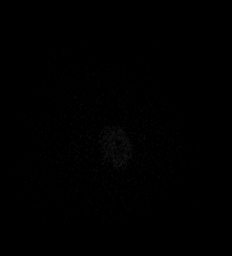

[Series 15: T2 · coronal · 5.0mm · 0.57mm/px · 2 of 29 slices shown (2 of 2)]
[im 1/29]
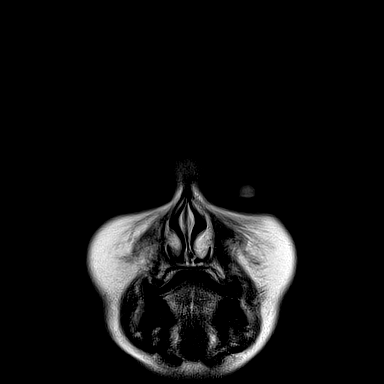
[im 29/29]
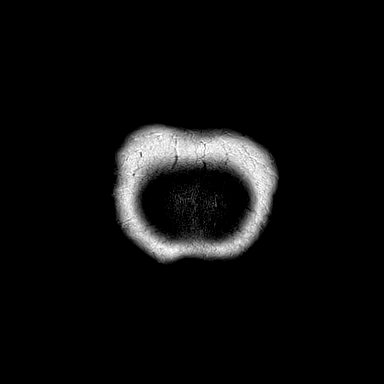

[34 of 48 positions shown; findings below may reference images not displayed]

FINDINGS: MRI HEAD FINDINGS

Brain: Diffuse prominence of the CSF containing spaces compatible
with generalized cerebral atrophy. Prominent patchy and confluent
T2/FLAIR hyperintensity within the periventricular and deep white
matter both cerebral hemispheres most consistent with chronic small
vessel ischemic disease, moderate in nature. Chronic microvascular
changes present within the pons. Few small remote bilateral
cerebellar infarcts noted.

Patchy small volume diffusion abnormality seen involving the right
basal ganglia/corona radiata (series 5, image 31). Mild patchy
involvement of the posterior right lentiform nucleus (series 5,
image 27). No associated mass effect or hemorrhage. No other
evidence for acute or subacute ischemia. No acute intracranial
hemorrhage.

No mass lesion, midline shift or mass effect. Diffuse ventricular
prominence most like related global parenchymal volume loss of
hydrocephalus. Underlying component of NPH would be difficult to
exclude. Pituitary gland normal.

Vascular: Major intracranial vascular flow voids maintained.

Skull and upper cervical spine: Craniocervical junction normal. No
focal marrow replacing lesion. Scalp soft tissues unremarkable.

Sinuses/Orbits: Globes and orbital soft tissues within normal
limits. Paranasal sinuses are clear. Trace right mastoid effusion,
of doubtful significance.

Other: None.

MRA HEAD FINDINGS

ANTERIOR CIRCULATION:

Examination technically degraded by motion artifact.

Petrous segments widely patent bilaterally. Scattered atheromatous
irregularity within the cavernous/supraclinoid ICAs without
hemodynamically significant stenosis. Focal severe stenosis at the
proximal right A1 segment (series 4032, image 12). A1 segments
otherwise patent. Grossly normal anterior communicating artery. ACAs
irregular but patent to their distal aspects without stenosis. M1
segments mildly irregular as well without high-grade stenosis.
Moderate atheromatous irregularity throughout the distal MCA
branches which are fairly well perfused and symmetric.

POSTERIOR CIRCULATION:

Visualized vertebral arteries patent to the vertebrobasilar junction
without stenosis. Right vertebral artery dominant. Basilar mildly
irregular but widely patent to its distal aspect without stenosis.
Superior cerebral arteries patent bilaterally. Both of the posterior
cerebral artery supplied via the basilar. Short-segment moderate
proximal left P2 and distal right P2 stenoses. PCAs patent to their
distal aspects.

No intracranial aneurysm.
IMPRESSION: MRI HEAD IMPRESSION:

1. Patchy small volume acute ischemic infarcts involving the right
basal ganglia/corona radiata without mass effect.
2. Underlying moderate to advanced chronic microvascular ischemic
disease, with few scatter remote bilateral cerebellar infarcts.
3. Diffuse ventricular prominence, most likely related to underlying
global atrophy. A superimposed component of NPH would be difficult
to exclude, and could be considered in the correct clinical setting.

MRA HEAD IMPRESSION:

1. Negative intracranial MRA for large vessel occlusion. No proximal
high-grade or correctable stenosis identified.
2. Moderate diffuse atheromatous irregularity throughout the
intracranial circulation.
